# Patient Record
Sex: Female | Born: 1960 | Race: White | Hispanic: No | Smoking: Current every day smoker
Health system: Southern US, Community
[De-identification: ages and names within clinical notes are randomized; demographics above are authoritative.]

## PROBLEM LIST (undated history)

## (undated) DIAGNOSIS — J302 Other seasonal allergic rhinitis: Secondary | ICD-10-CM

## (undated) DIAGNOSIS — G4730 Sleep apnea, unspecified: Secondary | ICD-10-CM

## (undated) DIAGNOSIS — J3089 Other allergic rhinitis: Secondary | ICD-10-CM

## (undated) DIAGNOSIS — F1910 Other psychoactive substance abuse, uncomplicated: Secondary | ICD-10-CM

## (undated) DIAGNOSIS — R000 Tachycardia, unspecified: Secondary | ICD-10-CM

## (undated) DIAGNOSIS — I471 Supraventricular tachycardia, unspecified: Secondary | ICD-10-CM

## (undated) DIAGNOSIS — E785 Hyperlipidemia, unspecified: Secondary | ICD-10-CM

## (undated) DIAGNOSIS — I10 Essential (primary) hypertension: Secondary | ICD-10-CM

## (undated) DIAGNOSIS — F32A Depression, unspecified: Secondary | ICD-10-CM

## (undated) DIAGNOSIS — G43909 Migraine, unspecified, not intractable, without status migrainosus: Secondary | ICD-10-CM

## (undated) DIAGNOSIS — K219 Gastro-esophageal reflux disease without esophagitis: Secondary | ICD-10-CM

## (undated) DIAGNOSIS — M549 Dorsalgia, unspecified: Secondary | ICD-10-CM

## (undated) DIAGNOSIS — F329 Major depressive disorder, single episode, unspecified: Secondary | ICD-10-CM

## (undated) HISTORY — DX: Migraine, unspecified, not intractable, without status migrainosus: G43.909

## (undated) HISTORY — DX: Hyperlipidemia, unspecified: E78.5

## (undated) HISTORY — PX: FOOT SURGERY: SHX648

## (undated) HISTORY — DX: Other allergic rhinitis: J30.89

## (undated) HISTORY — DX: Other psychoactive substance abuse, uncomplicated: F19.10

## (undated) HISTORY — DX: Supraventricular tachycardia: I47.1

## (undated) HISTORY — PX: ABDOMINAL HYSTERECTOMY: SHX81

## (undated) HISTORY — PX: TUBAL LIGATION: SHX77

## (undated) HISTORY — DX: Sleep apnea, unspecified: G47.30

## (undated) HISTORY — DX: Supraventricular tachycardia, unspecified: I47.10

## (undated) HISTORY — PX: CARDIAC CATHETERIZATION: SHX172

---

## 2001-09-02 ENCOUNTER — Ambulatory Visit (HOSPITAL_COMMUNITY): Admission: RE | Admit: 2001-09-02 | Discharge: 2001-09-02 | Payer: Self-pay | Admitting: Family Medicine

## 2001-09-02 ENCOUNTER — Encounter: Payer: Self-pay | Admitting: Family Medicine

## 2001-09-14 ENCOUNTER — Emergency Department (HOSPITAL_COMMUNITY): Admission: EM | Admit: 2001-09-14 | Discharge: 2001-09-14 | Payer: Self-pay | Admitting: Internal Medicine

## 2002-01-01 ENCOUNTER — Ambulatory Visit (HOSPITAL_COMMUNITY): Admission: RE | Admit: 2002-01-01 | Discharge: 2002-01-01 | Payer: Self-pay | Admitting: Family Medicine

## 2002-01-01 ENCOUNTER — Encounter: Payer: Self-pay | Admitting: Family Medicine

## 2002-03-12 ENCOUNTER — Encounter (INDEPENDENT_AMBULATORY_CARE_PROVIDER_SITE_OTHER): Payer: Self-pay

## 2002-03-12 ENCOUNTER — Ambulatory Visit (HOSPITAL_BASED_OUTPATIENT_CLINIC_OR_DEPARTMENT_OTHER): Admission: RE | Admit: 2002-03-12 | Discharge: 2002-03-12 | Payer: Self-pay | Admitting: Obstetrics and Gynecology

## 2002-05-29 ENCOUNTER — Encounter: Payer: Self-pay | Admitting: Internal Medicine

## 2002-05-29 ENCOUNTER — Emergency Department (HOSPITAL_COMMUNITY): Admission: EM | Admit: 2002-05-29 | Discharge: 2002-05-29 | Payer: Self-pay | Admitting: Internal Medicine

## 2002-06-01 ENCOUNTER — Ambulatory Visit (HOSPITAL_COMMUNITY): Admission: RE | Admit: 2002-06-01 | Discharge: 2002-06-01 | Payer: Self-pay | Admitting: *Deleted

## 2002-07-03 ENCOUNTER — Ambulatory Visit (HOSPITAL_COMMUNITY): Admission: RE | Admit: 2002-07-03 | Discharge: 2002-07-03 | Payer: Self-pay | Admitting: Internal Medicine

## 2002-07-07 ENCOUNTER — Encounter (INDEPENDENT_AMBULATORY_CARE_PROVIDER_SITE_OTHER): Payer: Self-pay | Admitting: Specialist

## 2002-07-07 ENCOUNTER — Inpatient Hospital Stay (HOSPITAL_COMMUNITY): Admission: RE | Admit: 2002-07-07 | Discharge: 2002-07-09 | Payer: Self-pay | Admitting: Obstetrics and Gynecology

## 2002-12-01 ENCOUNTER — Ambulatory Visit (HOSPITAL_COMMUNITY): Admission: RE | Admit: 2002-12-01 | Discharge: 2002-12-01 | Payer: Self-pay | Admitting: Family Medicine

## 2002-12-01 ENCOUNTER — Encounter: Payer: Self-pay | Admitting: Family Medicine

## 2003-01-14 ENCOUNTER — Ambulatory Visit (HOSPITAL_COMMUNITY): Admission: RE | Admit: 2003-01-14 | Discharge: 2003-01-14 | Payer: Self-pay | Admitting: Family Medicine

## 2003-04-01 ENCOUNTER — Ambulatory Visit (HOSPITAL_COMMUNITY): Admission: RE | Admit: 2003-04-01 | Discharge: 2003-04-01 | Payer: Self-pay | Admitting: Family Medicine

## 2003-05-11 ENCOUNTER — Ambulatory Visit (HOSPITAL_COMMUNITY): Admission: RE | Admit: 2003-05-11 | Discharge: 2003-05-11 | Payer: Self-pay | Admitting: Neurology

## 2003-08-11 ENCOUNTER — Encounter (HOSPITAL_COMMUNITY): Admission: RE | Admit: 2003-08-11 | Discharge: 2003-09-10 | Payer: Self-pay | Admitting: Family Medicine

## 2004-05-01 ENCOUNTER — Ambulatory Visit (HOSPITAL_COMMUNITY): Admission: RE | Admit: 2004-05-01 | Discharge: 2004-05-01 | Payer: Self-pay | Admitting: Family Medicine

## 2004-09-13 ENCOUNTER — Encounter (HOSPITAL_COMMUNITY): Admission: RE | Admit: 2004-09-13 | Discharge: 2004-10-13 | Payer: Self-pay | Admitting: Family Medicine

## 2005-04-16 ENCOUNTER — Encounter (HOSPITAL_COMMUNITY): Admission: RE | Admit: 2005-04-16 | Discharge: 2005-05-17 | Payer: Self-pay | Admitting: Neurology

## 2005-06-14 ENCOUNTER — Ambulatory Visit (HOSPITAL_COMMUNITY): Admission: RE | Admit: 2005-06-14 | Discharge: 2005-06-14 | Payer: Self-pay | Admitting: Neurology

## 2006-01-21 ENCOUNTER — Ambulatory Visit (HOSPITAL_COMMUNITY): Admission: RE | Admit: 2006-01-21 | Discharge: 2006-01-21 | Payer: Self-pay | Admitting: Family Medicine

## 2007-01-31 ENCOUNTER — Ambulatory Visit (HOSPITAL_COMMUNITY): Admission: RE | Admit: 2007-01-31 | Discharge: 2007-01-31 | Payer: Self-pay | Admitting: Family Medicine

## 2007-03-01 ENCOUNTER — Emergency Department (HOSPITAL_COMMUNITY): Admission: EM | Admit: 2007-03-01 | Discharge: 2007-03-01 | Payer: Self-pay | Admitting: Emergency Medicine

## 2007-06-09 ENCOUNTER — Emergency Department: Payer: Self-pay | Admitting: Emergency Medicine

## 2007-09-22 ENCOUNTER — Ambulatory Visit (HOSPITAL_COMMUNITY): Payer: Self-pay | Admitting: Psychiatry

## 2007-09-29 ENCOUNTER — Ambulatory Visit (HOSPITAL_COMMUNITY): Payer: Self-pay | Admitting: Psychiatry

## 2007-10-07 ENCOUNTER — Ambulatory Visit (HOSPITAL_COMMUNITY): Payer: Self-pay | Admitting: Psychiatry

## 2007-10-29 ENCOUNTER — Ambulatory Visit (HOSPITAL_COMMUNITY): Payer: Self-pay | Admitting: Psychiatry

## 2007-11-12 ENCOUNTER — Ambulatory Visit (HOSPITAL_COMMUNITY): Payer: Self-pay | Admitting: Psychiatry

## 2007-11-19 ENCOUNTER — Ambulatory Visit (HOSPITAL_COMMUNITY): Payer: Self-pay | Admitting: Psychiatry

## 2007-12-10 ENCOUNTER — Ambulatory Visit (HOSPITAL_COMMUNITY): Payer: Self-pay | Admitting: Psychiatry

## 2007-12-25 ENCOUNTER — Ambulatory Visit (HOSPITAL_COMMUNITY): Payer: Self-pay | Admitting: Psychiatry

## 2007-12-31 ENCOUNTER — Ambulatory Visit (HOSPITAL_COMMUNITY): Payer: Self-pay | Admitting: Psychiatry

## 2008-01-15 ENCOUNTER — Ambulatory Visit (HOSPITAL_COMMUNITY): Payer: Self-pay | Admitting: Psychiatry

## 2008-01-28 ENCOUNTER — Ambulatory Visit (HOSPITAL_COMMUNITY): Payer: Self-pay | Admitting: Psychiatry

## 2008-02-18 ENCOUNTER — Ambulatory Visit (HOSPITAL_COMMUNITY): Payer: Self-pay | Admitting: Psychiatry

## 2008-02-25 ENCOUNTER — Ambulatory Visit (HOSPITAL_COMMUNITY): Admission: RE | Admit: 2008-02-25 | Discharge: 2008-02-25 | Payer: Self-pay | Admitting: Family Medicine

## 2008-03-10 ENCOUNTER — Ambulatory Visit (HOSPITAL_COMMUNITY): Admission: RE | Admit: 2008-03-10 | Discharge: 2008-03-10 | Payer: Self-pay | Admitting: Family Medicine

## 2008-07-27 ENCOUNTER — Ambulatory Visit (HOSPITAL_COMMUNITY): Payer: Self-pay | Admitting: Psychiatry

## 2009-08-21 ENCOUNTER — Emergency Department (HOSPITAL_COMMUNITY): Admission: EM | Admit: 2009-08-21 | Discharge: 2009-08-21 | Payer: Self-pay | Admitting: Emergency Medicine

## 2010-03-18 ENCOUNTER — Inpatient Hospital Stay (HOSPITAL_COMMUNITY)
Admission: EM | Admit: 2010-03-18 | Discharge: 2010-03-21 | Payer: Self-pay | Source: Home / Self Care | Attending: Family Medicine | Admitting: Family Medicine

## 2010-03-20 ENCOUNTER — Ambulatory Visit (HOSPITAL_COMMUNITY)
Admission: RE | Admit: 2010-03-20 | Discharge: 2010-03-20 | Payer: Self-pay | Source: Home / Self Care | Attending: Urology | Admitting: Urology

## 2010-03-20 LAB — CBC
HCT: 43.3 % (ref 36.0–46.0)
HCT: 29.4 % — ABNORMAL LOW (ref 36.0–46.0)
Hemoglobin: 14.4 g/dL (ref 12.0–15.0)
Hemoglobin: 9.6 g/dL — ABNORMAL LOW (ref 12.0–15.0)
MCH: 29.3 pg (ref 26.0–34.0)
MCH: 29 pg (ref 26.0–34.0)
MCHC: 33.3 g/dL (ref 30.0–36.0)
MCHC: 32.7 g/dL (ref 30.0–36.0)
MCV: 88 fL (ref 78.0–100.0)
MCV: 88.8 fL (ref 78.0–100.0)
Platelets: 401 10*3/uL — ABNORMAL HIGH (ref 150–400)
Platelets: 222 10*3/uL (ref 150–400)
RBC: 4.92 MIL/uL (ref 3.87–5.11)
RBC: 3.31 MIL/uL — ABNORMAL LOW (ref 3.87–5.11)
RDW: 14.2 % (ref 11.5–15.5)
RDW: 14.5 % (ref 11.5–15.5)
WBC: 14.8 10*3/uL — ABNORMAL HIGH (ref 4.0–10.5)
WBC: 6.7 10*3/uL (ref 4.0–10.5)

## 2010-03-20 LAB — DIFFERENTIAL
Basophils Absolute: 0 10*3/uL (ref 0.0–0.1)
Basophils Absolute: 0 10*3/uL (ref 0.0–0.1)
Basophils Relative: 0 % (ref 0–1)
Basophils Relative: 0 % (ref 0–1)
Eosinophils Absolute: 0 10*3/uL (ref 0.0–0.7)
Eosinophils Absolute: 0.1 10*3/uL (ref 0.0–0.7)
Eosinophils Relative: 0 % (ref 0–5)
Eosinophils Relative: 1 % (ref 0–5)
Lymphocytes Relative: 3 % — ABNORMAL LOW (ref 12–46)
Lymphocytes Relative: 32 % (ref 12–46)
Lymphs Abs: 0.4 10*3/uL — ABNORMAL LOW (ref 0.7–4.0)
Lymphs Abs: 2.1 10*3/uL (ref 0.7–4.0)
Monocytes Absolute: 0.6 10*3/uL (ref 0.1–1.0)
Monocytes Absolute: 0.4 10*3/uL (ref 0.1–1.0)
Monocytes Relative: 4 % (ref 3–12)
Monocytes Relative: 6 % (ref 3–12)
Neutro Abs: 13.8 10*3/uL — ABNORMAL HIGH (ref 1.7–7.7)
Neutro Abs: 4.2 10*3/uL (ref 1.7–7.7)
Neutrophils Relative %: 93 % — ABNORMAL HIGH (ref 43–77)
Neutrophils Relative %: 62 % (ref 43–77)

## 2010-03-20 LAB — URINALYSIS, ROUTINE W REFLEX MICROSCOPIC
Ketones, ur: 15 mg/dL — AB
Nitrite: NEGATIVE
Protein, ur: 100 mg/dL — AB
Specific Gravity, Urine: 1.028 (ref 1.005–1.030)
Urine Glucose, Fasting: 100 mg/dL — AB
Urobilinogen, UA: 1 mg/dL (ref 0.0–1.0)
pH: 5.5 (ref 5.0–8.0)

## 2010-03-20 LAB — POCT I-STAT, CHEM 8
BUN: 17 mg/dL (ref 6–23)
Calcium, Ion: 0.92 mmol/L — ABNORMAL LOW (ref 1.12–1.32)
Chloride: 112 mEq/L (ref 96–112)
Creatinine, Ser: 1.7 mg/dL — ABNORMAL HIGH (ref 0.4–1.2)
Glucose, Bld: 155 mg/dL — ABNORMAL HIGH (ref 70–99)
HCT: 48 % — ABNORMAL HIGH (ref 36.0–46.0)
Hemoglobin: 16.3 g/dL — ABNORMAL HIGH (ref 12.0–15.0)
Potassium: 3.6 mEq/L (ref 3.5–5.1)
Sodium: 140 mEq/L (ref 135–145)
TCO2: 17 mmol/L (ref 0–100)

## 2010-03-20 LAB — LIPID PANEL
Cholesterol: 154 mg/dL (ref 0–200)
HDL: 36 mg/dL — ABNORMAL LOW (ref >39)
LDL Cholesterol: 82 mg/dL (ref 0–99)
Total CHOL/HDL Ratio: 4.3 RATIO
Triglycerides: 181 mg/dL — ABNORMAL HIGH (ref <150)
VLDL: 36 mg/dL (ref 0–40)

## 2010-03-20 LAB — HEPATIC FUNCTION PANEL
ALT: 13 U/L (ref 0–35)
AST: 27 U/L (ref 0–37)
Albumin: 4.5 g/dL (ref 3.5–5.2)
Alkaline Phosphatase: 83 U/L (ref 39–117)
Bilirubin, Direct: 0.1 mg/dL (ref 0.0–0.3)
Indirect Bilirubin: 0.6 mg/dL (ref 0.3–0.9)
Total Bilirubin: 0.7 mg/dL (ref 0.3–1.2)
Total Protein: 8.1 g/dL (ref 6.0–8.3)

## 2010-03-20 LAB — BASIC METABOLIC PANEL
BUN: 6 mg/dL (ref 6–23)
CO2: 21 mEq/L (ref 19–32)
Calcium: 7.7 mg/dL — ABNORMAL LOW (ref 8.4–10.5)
Chloride: 117 mEq/L — ABNORMAL HIGH (ref 96–112)
Creatinine, Ser: 0.57 mg/dL (ref 0.4–1.2)
GFR calc Af Amer: >60 mL/min (ref >60)
GFR calc non Af Amer: >60 mL/min (ref >60)
Glucose, Bld: 87 mg/dL (ref 70–99)
Potassium: 3.2 mEq/L — ABNORMAL LOW (ref 3.5–5.1)
Sodium: 141 mEq/L (ref 135–145)

## 2010-03-20 LAB — URINE MICROSCOPIC-ADD ON

## 2010-03-20 LAB — LIPASE, BLOOD: Lipase: 14 U/L (ref 11–59)

## 2010-03-20 LAB — CLOSTRIDIUM DIFFICILE BY PCR: Toxigenic C. Difficile by PCR: NEGATIVE

## 2010-03-20 LAB — TSH: TSH: 0.309 u[IU]/mL — ABNORMAL LOW (ref 0.350–4.500)

## 2010-03-22 LAB — BASIC METABOLIC PANEL
BUN: <1 mg/dL — ABNORMAL LOW (ref 6–23)
CO2: 23 mEq/L (ref 19–32)
Calcium: 8 mg/dL — ABNORMAL LOW (ref 8.4–10.5)
Chloride: 113 mEq/L — ABNORMAL HIGH (ref 96–112)
Creatinine, Ser: 0.46 mg/dL (ref 0.4–1.2)
GFR calc Af Amer: >60 mL/min (ref >60)
GFR calc non Af Amer: >60 mL/min (ref >60)
Glucose, Bld: 100 mg/dL — ABNORMAL HIGH (ref 70–99)
Potassium: 3.3 mEq/L — ABNORMAL LOW (ref 3.5–5.1)
Sodium: 142 mEq/L (ref 135–145)

## 2010-03-22 LAB — CBC
HCT: 26.4 % — ABNORMAL LOW (ref 36.0–46.0)
Hemoglobin: 8.7 g/dL — ABNORMAL LOW (ref 12.0–15.0)
MCH: 28.8 pg (ref 26.0–34.0)
MCHC: 33 g/dL (ref 30.0–36.0)
MCV: 87.4 fL (ref 78.0–100.0)
Platelets: 198 10*3/uL (ref 150–400)
RBC: 3.02 MIL/uL — ABNORMAL LOW (ref 3.87–5.11)
RDW: 14.3 % (ref 11.5–15.5)
WBC: 5.8 10*3/uL (ref 4.0–10.5)

## 2010-03-22 LAB — URINE CULTURE
Colony Count: 8000
Culture  Setup Time: 201201160228

## 2010-03-22 LAB — RAPID URINE DRUG SCREEN, HOSP PERFORMED
Amphetamines: NOT DETECTED
Barbiturates: NOT DETECTED
Benzodiazepines: NOT DETECTED
Cocaine: NOT DETECTED
Opiates: POSITIVE — AB
Tetrahydrocannabinol: NOT DETECTED

## 2010-03-22 LAB — SODIUM, URINE, RANDOM: Sodium, Ur: 70 mEq/L

## 2010-03-22 LAB — T3, FREE: T3, Free: 3.1 pg/mL (ref 2.3–4.2)

## 2010-03-22 LAB — T4, FREE: Free T4: 0.82 ng/dL (ref 0.80–1.80)

## 2010-03-22 LAB — CREATININE, URINE, RANDOM: Creatinine, Urine: 34.2 mg/dL

## 2010-03-27 NOTE — Op Note (Signed)
  Heather Cruz, Heather Cruz                  ACCOUNT NO.:  [id number]  MEDICAL RECORD NO.:  [id number]          PATIENT TYPE:  INP  LOCATION:  6704                         FACILITY:  [facility]  PHYSICIAN:  Excell Seltzer. Annabell Howells, M.D.    DATE OF BIRTH:  Aug 28, 1960  DATE OF PROCEDURE:  03/20/2010 DATE OF DISCHARGE:                              OPERATIVE REPORT   Patient of Dr. Pearlean Brownie.  PROCEDURE:  Cystoscopy, left ureteroscopic stone extraction.  PREOPERATIVE DIAGNOSIS:  Left ureterovesical junction stone.  POSTOPERATIVE DIAGNOSIS:  Left ureterovesical junction stone.  SURGEON:  Excell Seltzer. Annabell Howells, MD  ANESTHESIA:  General.  SPECIMEN:  Stone.  DRAIN:  None.  COMPLICATIONS:  None.  INDICATIONS:  [name] is a 50 year old white female with a 40-month history of hematuria and intermittent left flank pain.  She has not been able to be maintained on oral pain medicines and a CT scan revealed a 7 mm left distal ureteral stone.  She has elected to undergo ureteroscopy.  FINDINGS AND PROCEDURE:  She was given Cipro.  She was taken to operating room where general anesthetic was induced.  She was placed in lithotomy position.  Her perineum and genitalia were prepped with Betadine solution.  She was draped in usual sterile fashion.  Cystoscopy was performed with a 22-French scope and 12-degree lens examination revealed a normal urethra.  The bladder wall was smooth and pale without tumor, stones, or inflammation.  Ureteral orifices were unremarkable.  A guidewire was passed up the left ureteral orifice and the stone could be seen alongside the wire under fluoroscopy.  A 12-French introducer sheath dilator was passed over the wire to the stone to dilate the ureter and a 6-French short ureteroscope was then passed alongside the wire.  The stone was grasped with a nitinol basket and removed without difficulty.  It was not felt the stent was needed, so the wire was removed.  The bladder was  drained.  A B and O suppository was placed. She was taken down from lithotomy position, her anesthetic was reversed, and she was moved to the recovery room in stable condition.     Excell Seltzer. Annabell Howells, M.D.     JJW/MEDQ  D:  03/20/2010  T:  03/20/2010  Job:  [id number]  Electronically Signed by Bjorn Pippin M.D. on 03/27/2010 08:13:49 AM

## 2010-03-31 NOTE — Discharge Summary (Signed)
Heather Cruz, Heather Cruz                  ACCOUNT NO.:  [id number]  MEDICAL RECORD NO.:  [id number]          PATIENT TYPE:  INP  LOCATION:  6704                         FACILITY:  [facility]  PHYSICIAN:  Heather Cruz, M.D.    DATE OF BIRTH:  February 28, 1961  DATE OF ADMISSION:  03/18/2010 DATE OF DISCHARGE:  03/21/2010                              DISCHARGE SUMMARY   PRIMARY CARE Heather Cruz:  [facility]  CONSULTANT:  Urology.  DISCHARGE DIAGNOSES: 1. Kidney stone status post cystoscopy. 2. Tachycardia. 3. Acute renal failure, resolved.  DISCHARGE MEDICATIONS: 1. Ibuprofen 600 mg p.o. four times a day p.r.n. for pain. 2. Percocet 5/325 mg p.o. q.4-6 h. p.r.n. for pain.  Continued Medications: 1. Benadryl 25 mg 1-2 tablets p.o. b.i.d. p.r.n. for itching. 2. Ranitidine 75 mg p.o. b.i.d.  PERTINENT LABORATORY VALUES:  On March 18, 2010:  An i-STAT; total CO2 is 17, ionized calcium 0.92, hemoglobin 16.3, hematocrit 48.0, sodium 140, potassium 3.6, chloride 112, glucose 155, BUN 17, creatinine 1.7. CBC with differential; white blood cell count 14.8, hemoglobin 14.4, hematocrit 43.3, platelets 401, neutrophils 93%, lymphocytes 3%, monocytes 4%.  A urinalysis was significant for urine glucose 100, bilirubin moderate, ketones 15, blood large, protein 100, and leukocytes small.  Urine microscopic showed squamous epithelial cells few, hyaline casts, white blood cells 3-6, red blood cells 11-12, bacteria few.  TSH was 0.309. On March 20, 2010:  Basic metabolic panel; sodium 142, potassium 3.3, chloride 113, CO2 is 23, glucose 100, BUN less than 1, creatinine 0.46, calcium 8.0.  A urine drug screen was negative.  Free T4 was 0.82 within normal limits and free T3 was 3.1 within normal limits.  Urine culture was negative.  RADIOLOGY:  A CT abdomen and pelvis without contrast showed obstruction of left kidney by distal ureteral calculus measuring 3 x 7 mm.  PROCEDURES:   On March 20, 2010, Urology performed a cystoscopy, left ureteroscopic stone extraction.  BRIEF HOSPITAL COURSE:  Ms. Heather Cruz is a 50 year old lady with past medical history of kidney stones who presented to the emergency department with nausea, vomiting, fever, flank pain.  She was found to have a kidney stone. 1. The patient was admitted for pain control.  She was placed on IV     fluids and Flomax and IV morphine and given a urine strainer;     however, the patient was unable to pass the kidney stone alone     after 2 days.  Urology was consulted, and they did a cystoscopy to     remove the stone on August 19, 2010.  The day of the procedure, the     patient was feeling much better, minimal pain, increased p.o.     intake. 2. Tachycardia.  The patient had mild tachycardia in the 110s while in     the hospital.  The patient reported she had had this problem for a     long time and had previous ablations that had failed to control her     tachycardia.  A TSH was checked which was low; however, free  T3 and     T4 were within normal limits.  The day after her kidney stone     removal, her tachycardia had resolved with pulse rate lower than a     100.  The [facility] Medicine Team felt that this was stable normal for     this patient and did not start any intervention. 3. Acute renal failure.  The patient came in with a creatinine of 1.7,     and she was hydrated.  She was dry on exam and so she was hydrated     with normal saline on admission.  Her creatinine normalized to 0.84     before discharge.  It was felt this was due to poor p.o. intake,     dehydration, and kidney stone.  FOLLOWUP ISSUES AND RECOMMENDATIONS:  The patient is to follow up with Urology in 3-4 weeks.  The patient is also recommended to follow up with Dr. Gerda Cruz at [facility] in 1-2 weeks.  The patient was discharged home in stable medical condition.    ______________________________ Heather Gal, MD   ______________________________ Heather Norris. Sheffield Cruz, M.D.    CR/MEDQ  D:  03/21/2010  T:  03/21/2010  Job:  [id number]  Electronically Edited and Signed  By Heather Gal MD on 03/26/2010 06:55:23 PM Electronically Signed by Heather Cruz M.D. on 03/31/2010 06:31:38 PM

## 2010-04-19 NOTE — H&P (Signed)
Heather Cruz, Heather Cruz                  ACCOUNT NO.:  [id number]  MEDICAL RECORD NO.:  [id number]          PATIENT TYPE:  INP  LOCATION:  1825                         FACILITY:  [facility]  PHYSICIAN:  Pearlean Brownie, M.D.DATE OF BIRTH:  1960-05-19  DATE OF ADMISSION:  03/18/2010 DATE OF DISCHARGE:                             HISTORY & PHYSICAL   PRIMARY CARE PROVIDER:  Unassigned.  CHIEF COMPLAINT:  Kidney stones.  HISTORY OF PRESENT ILLNESS:  This is a 50 year old female with self- reported history of kidney stones who presents with severe nausea, vomiting, diarrhea, and abdominal pain x1 day.  The patient states her symptoms have began acutely yesterday evening.  The patient woke up this morning with intractable nonbloody, nonbilious emesis as well as nonbloody diarrhea.  The patient denies any sick contacts.  The patient does report dysuria as well as hematuria.  The patient states that nausea, vomiting, and abdominal pain persists throughout the day and has been unable to tolerate by mouth intake as well as progressively decreasing urine output.  The patient subsequently came to the ED for evaluation and was found to have a 3 x 7 mm kidney stone on the left distal ureter.  The patient has also noted to have sinus tachycardia into the 130s.  The patient states she has had a history of sinus tachycardia before status post ablation several years ago.  The patient was previously on Inderal per the patient.  However, the patient has been off medications for greater than 2 years as she has been unemployed.  The patient denies any chest pain, palpitations, dyspnea, or orthopnea.  ALLERGIES:  PENICILLIN.  PAST MEDICAL HISTORY: 1. History of kidney stones. 2. History of depression. 3. 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 4. Hyperlipidemia. 5. GERD.  MEDICATIONS:  The patient currently takes over-the-counter Tylenol, Benadryl, and Prilosec.  The patient does report  a history of taking Cymbalta, Inderal, as well as TriCor and Protonix in the past for chronic medical problems, however the patient has not been able to afford this medications over the past 2 years.  PAST SURGICAL HISTORY: 1. Ablation in 2000. 2. History of partial hysterectomy in 2000. 3. Morton's neuroma removal x3.  SOCIAL HISTORY:  The patient currently lives with her ex-husband as well as ex-stepdaughter.  The patient is currently unemployed.  The patient reportedly smokes 1 pack per day of cigarettes.  The patient denies any alcohol or other drug use.  FAMILY HISTORY:  Family history is negative for kidney stones, hypertension, and diabetic disease.  REVIEW OF SYSTEMS:  Review of systems positive for chills, sweats, fatigue, nausea, vomiting, diarrhea as well as hematuria and dysuria. No myalgias, chest pain, orthopnea, PND, or palpitations.  PHYSICAL EXAMINATION:  VITAL SIGNS:  Temperature 98.9, heart rate 118- 135, respirations 14-20, blood pressure 97 to 129 over 55 to 72, satting98% on room air, and weight 57.2 kg. GENERAL:  The patient is in bed, resting in minimal distress. HEENT:  Normocephalic, atraumatic.  Extraocular movements intact.  Dry mucous membranes. NECK:  Supple.  Full range of motion. CARDIOVASCULAR:  Tachycardic without murmur. LUNGS:  Clear  to auscultation bilaterally.  No wheezes, rales, or rhonchi. ABDOMEN:  Positive test on palpation in the lower abdomen diffusely most prominent on the left side. EXTREMITIES:  A 2+ peripheral pulses.  No edema. NEUROLOGIC:  Grossly intact. MUSCULOSKELETAL:  A 5/5 strength diffusely.  LABORATORY DATA:  Labs CBC; white count 14.8, hemoglobin 14.4, and platelet count 401. CMET; sodium 140, potassium 3.6, chloride 112, BUN 17, creatinine 1.7. T bili is 0.74, alk phos 83, AST 27, ALT 13, total protein 8.1, and albumin 4.5. Lipase of 14. UA showing orange-to-turbid appearance, specific gravity of 10.28, moderate  bili, 15 ketones, large blood, protein 100, small leukocytes and hyaline casts.  IMAGING DATA:  CT abdomen and pelvis without contrast showing obstructing left stone and distal ureter measuring 3 x 7 mm.  ASSESSMENT/PLAN:  This is a 50 year old female with past medical history of nephrolithiasis presenting with acute kidney stone, acute renal failure with sinus tachycardia.  1. Kidney stone.  Urology initially consulted given that kidney stone     is greater than 5 mm on CT with recommendation to continue Flomax,     to continue aggressive IV fluid hydration as well as drain of her     urine.  Zofran for nausea.  Morphine for pain.  We will hold on     NSAIDs given the patient is also in acute renal failure. 2. Acute renal failure.  The patient is noted to have baseline     creatinine of 0.7 as of 2011.  Today creatinine is 1.7, which is     likely secondary to upper and lower gastrointestinal losses.  We     will check FENa.  We will continue aggressive hydration.  CT scan     is positive for obstruction on the left-side and we will continue     to closely follow with Urology for adequate urine output and     adequate diuresis. 3. Tachycardia.  The patient reports being status post ablation     several years ago, was previously on Inderal.  We will start the     patient on low-dose Lopressor for BP for better beta selective heart     rate control in the setting of volume resuscitation.  The patient     is overall asymptomatic.  We will also check a TSH and the patient     may require inpatient versus outpatient echo for further     evaluation. 4. Fluids, electrolyte, and nutrition/Gastrointestinal.  Advance diet     as tolerated.  Aggressive fluid hydration and PPI. 5. Hyperlipidemia.  We will check fasting lipid panel. 6. Prophylaxis, heparin and Protonix. 7. Disposition.  Pending clinical improvement.     Doree Albee, MD   ______________________________ Pearlean Brownie, M.D.    SN/MEDQ  D:  03/18/2010  T:  03/18/2010  Job:  [id number]  Electronically Signed by Doree Albee  on 04/01/2010 09:56:22 AM Electronically Signed by Pearlean Brownie M.D. on 04/19/2010 04:56:06 PM

## 2010-05-11 ENCOUNTER — Emergency Department (HOSPITAL_COMMUNITY)
Admission: EM | Admit: 2010-05-11 | Discharge: 2010-05-11 | Disposition: A | Discharge status: Home / Self Care | Payer: Self-pay | Attending: Emergency Medicine | Admitting: Emergency Medicine

## 2010-05-11 DIAGNOSIS — F3289 Other specified depressive episodes: Secondary | ICD-10-CM | POA: Insufficient documentation

## 2010-05-11 DIAGNOSIS — I499 Cardiac arrhythmia, unspecified: Secondary | ICD-10-CM | POA: Insufficient documentation

## 2010-05-11 DIAGNOSIS — F329 Major depressive disorder, single episode, unspecified: Secondary | ICD-10-CM | POA: Insufficient documentation

## 2010-05-11 DIAGNOSIS — G43909 Migraine, unspecified, not intractable, without status migrainosus: Secondary | ICD-10-CM | POA: Insufficient documentation

## 2010-05-11 DIAGNOSIS — E7800 Pure hypercholesterolemia, unspecified: Secondary | ICD-10-CM | POA: Insufficient documentation

## 2010-05-21 LAB — POCT I-STAT, CHEM 8
Creatinine, Ser: 0.7 mg/dL (ref 0.4–1.2)
Hemoglobin: 15 g/dL (ref 12.0–15.0)
Sodium: 141 mEq/L (ref 135–145)
TCO2: 25 mmol/L (ref 0–100)

## 2010-05-21 LAB — CBC
Hemoglobin: 14.3 g/dL (ref 12.0–15.0)
RBC: 4.75 MIL/uL (ref 3.87–5.11)

## 2010-05-21 LAB — URINALYSIS, ROUTINE W REFLEX MICROSCOPIC
Glucose, UA: NEGATIVE mg/dL
Specific Gravity, Urine: 1.009 (ref 1.005–1.030)

## 2010-05-21 LAB — DIFFERENTIAL
Lymphocytes Relative: 31 % (ref 12–46)
Monocytes Absolute: 0.6 10*3/uL (ref 0.1–1.0)
Monocytes Relative: 5 % (ref 3–12)
Neutro Abs: 7.5 10*3/uL (ref 1.7–7.7)

## 2010-05-21 LAB — URINE MICROSCOPIC-ADD ON

## 2010-07-21 NOTE — Op Note (Signed)
NAME:  Heather Cruz, Heather Cruz                            ACCOUNT NO.:  [id number]   MEDICAL RECORD NO.:  [id number]                   PATIENT TYPE:  INP   LOCATION:  NA                                   [facility]AC[facility]   PHYSICIAN:  Katherine Roan, M.D.               DATE OF BIRTH:  19-Mar-1960   DATE OF PROCEDURE:  07/07/2002  DATE OF DISCHARGE:                                 OPERATIVE REPORT   PREOPERATIVE DIAGNOSIS:  Persistent menorrhagia despite birth control pills,  hysteroscopy with resection, and laparoscopy.   POSTOPERATIVE DIAGNOSIS:  Persistent menorrhagia despite birth control  pills, hysteroscopy with resection, and laparoscopy.   OPERATION:  1. Pelvic exam under anesthesia.  2. Total abdominal hysterectomy.  3. Exploratory laparotomy.   DESCRIPTION OF PROCEDURE:  The patient was placed in the frogleg position  after suitable anesthesia was administered.  Pelvic exam reveals a mid plane  uterus that appeared to be normal size with no masses.  The patient was then  prepped and draped in the usual fashion.  Foley catheter was inserted.  A  transverse incision was made in the abdomen and hemostasis with the Bovie.  The peritoneum was entered vertically.  There was a midline ventral defect  just at the xiphoid.  The liver appeared to be smooth.  Kidneys were normal.  No evident periaortic adenopathy.  No free fluid.  Both ovaries were normal.  There was some endometriosis and endometriotic changes in the left cornua  and in the cul-de-sac.  Cornual aspects of the uterus were grasped with  Kelly clamps, and the uteroovarian anastomosis and tube were clamped and  ligated with 0 chromic.  The round ligaments were handled in a similar  fashion.  The vessels were skeletonized and ligated with 0 chromic suture.  Bladder flap was created.  The bladder flap was created.  The bladder was  pushed off the lower segment.  Uterosacral and cardinals were clamped and  ligated with 0 chromic.   The angles of the vagina were entered, specimens  removed from the operative field was consisting of the uterus.  The angles  of the vagina were transfixed with 0 chromic and then 0 Vicryl.  The  uterosacral ligaments were plicated in the midline to ensure good vault  support.  Both ovaries were normal.  Hemostasis was secure.  We irrigated  the pelvis with copious amounts of saline.  Parietal peritoneum was closed  with 2-0 PDS as well as the fascia in a continuous fashion and interrupted 2-  0 PDS in the midline.  A subcuticular suture of 3-0 plain was used to close  the skin, and the incision was infiltrated with 20 mL of 0.5% Marcaine with  epinephrine.  [name] tolerated the procedure well and was sent to the  recovery room in good condition.  Katherine Roan, M.D.    SDM/MEDQ  D:  07/07/2002  T:  07/07/2002  Job:  [id number]

## 2010-07-21 NOTE — Op Note (Signed)
Heather Cruz, Heather Cruz                            ACCOUNT NO.:  [id number]   MEDICAL RECORD NO.:  [id number]                   PATIENT TYPE:  OIB   LOCATION:  3737                                 FACILITY:  [facility]   PHYSICIAN:  Duke Salvia, M.D.               DATE OF BIRTH:  05/23/1960   DATE OF PROCEDURE:  07/03/2002  DATE OF DISCHARGE:                                 OPERATIVE REPORT   PREOPERATIVE DIAGNOSIS:  Supraventricular tachycardia.   POSTOPERATIVE DIAGNOSIS:  Slow-fast atrioventricular reentrant tachycardia.   PROCEDURE:  Invasive electrophysiological study, isoproterenol/atropine  infusion, and radiofrequency catheter ablation.   DESCRIPTION OF PROCEDURE:  Following the obtaining of informed consent, the  patient was brought to the electrophysiology laboratory and placed on the  fluoroscopic table in the supine position.  After routine prep and drape,  cardiac catheterization was performed with local anesthesia and conscious  sedation.  Noninvasive blood pressure monitoring, end-tidal CO2 monitoring,  and oxygen saturation monitoring were performed continuously throughout the  procedure.  At the end of the procedure the catheters were removed,  hemostasis was obtained, and the patient was transferred to the floor in  stable condition.   Catheters:  A 5 French quadripolar catheter was inserted via the left  femoral vein to the right ventricular apex.  A 5 French quadripolar catheter  was inserted via the left femoral vein to the AV junction.  A 5 French  quadripolar catheter was inserted via the left femoral vein to the high  right atrium.  A 6 French octapolar catheter was inserted via the right  femoral vein to the coronary sinus.  A 7 French quadripolar ablation  catheter was inserted via the right femoral vein using an SL-2 sheath to  mapping sites in the posterior septal space.   Surface leads I, aVF, and V1 were monitored continuously throughout the  procedure.   Following insertion of the catheters, the stimulation protocol  included incremental atrial pacing; incremental ventricular pacing; single  atrial extrastimuli at pace cycle lengths of 600s, 550, 500, and 400 msec;  double atrial extrastimuli at 400 msec in the presence and in the absence of  isoproterenol.   RESULTS:  SURFACE ELECTROCARDIOGRAM:  Rhythm:  Sinus (initial), sinus (final).  Cycle length:  655 msec (initial), 482 msec (final) (on isoproterenol).  PR interval:  122 msec (initial), 115 msec (final).  QRS duration:  98 msec (initial), 94 msec (final).  QT interval:  370 msec (initial), 340 msec (final).  P-wave duration:  90 msec (initial), 75 msec (final).   INTRACARDIAC INTERVALS:  AH interval:  80 msec (initial), 71 msec (final).  HV interval:  36 msec (initial), 34 msec (final).  His bundle duration:  19 msec (initial), 17 msec (final).  Bundle branch block:  Absent (initial), absent (final).  Pre-excitation:  Absent (initial), absent (final).   AV NODAL FUNCTION:  AV Wenckebach  preablation was less than 400 msec, and VA Wenckebach was less  than 300 msec with conduction going from 1:1 to 2:1 AT 290 msec.  The AV nodal effective refractory period at a pace cycle length of 500 msec  in the fast pathway was 400 msec, in the slow pathway was 290 msec.  The AV nodal conduction was discontinuous with an echo beat preablation and  had intermittent discontinuity and echo beats post ablation.  Retrograde conduction was continuous pre- and post ablation.   ACCESSORY PATHWAY FUNCTION:  No evidence of an accessory pathway was  identified.   ARRHYTHMIAS INDUCED:  Typical slow-fast AV nodal reentrant tachycardia was  inducible in the context of atropine and isoproterenol.  Initially prior to  the combination drugs, either single or double echo beats were all that were  inducible.  At  400:250:300 typical slow-fast AV nodal reentrant tachycardia  was induced with a cycle length  of 285 msec.  It terminated spontaneously.   Characteristics of the tachycardia included:  (a) Earliest atrial activation  the septum; (b) dependent on AH prolongation; (c) in a typical episode of  tachycardia the VA time was 0 msec, the HA time was 37 msec, and the  AH  time was 245 msec.   FLUOROSCOPY TIME:  Less than five minutes of fluoroscopy time was used at 15  frames per second.   RADIOFREQUENCY ENERGY:  A total of 40 seconds of RF energy was applied in a  single application on the posterior septal space.  Junctional rhythm was  generated at three separate sites during the drawback through the tricuspid  annulus toward the coronary sinus at the level of the coronary sinus os.  Following ablation, tachycardia was no longer inducible in the presence or  in the absence of isoproterenol plus/minus atropine as well as discontinuity  and any echo beats being only intermittently detected.   IMPRESSION:  1. Relatively rapid sinus rates with persistent sinus tachycardia after the     procedure; this is something that has been described in the patient     before.  2. Normal atrial function.  3. 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  Slow pathway modification     successfully eliminated the substrate for the patient's tachycardia and     rendered her noninducible.  4. Normal His-Purkinje system function.  5. No accessory pathway.  6. Normal ventricular response to programmed stimulation as described above.   SUMMARY AND CONCLUSION:  The results of electrophysiological testing  identified slow-fast AV nodal reentrant tachycardia as the patient's  mechanism of her SVT.  Slow pathway modification successfully eliminated  the substrate and rendered her noninducible.  The patient also has fatigue and resting tachycardia.  I had given some thought to doing tilt table  testing prior to her EP study; however, this did not  happen.  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                                                 Duke Salvia, M.D.    SCK/MEDQ  D:  07/03/2002  T:  07/03/2002  Job:  [id number]   cc:   Vida Roller, M.D.  Fax: [phone]   W. Simone Curia,  M.D.  [address]  [place]  [place] [address]  Fax: [phone]   Electrophysiology Laboratory

## 2010-07-21 NOTE — Op Note (Signed)
Heather Cruz, Heather Cruz                            ACCOUNT NO.:  [id number]   MEDICAL RECORD NO.:  [id number]                   PATIENT TYPE:  AMB   LOCATION:  [facility]                                 FACILITY:  [facility]   PHYSICIAN:  Katherine Roan, M.D.               DATE OF BIRTH:  04-08-1960   DATE OF PROCEDURE:  03/12/2002  DATE OF DISCHARGE:                                 OPERATIVE REPORT   PREOPERATIVE DIAGNOSES:  Menorrhagia and dysmenorrhea.   POSTOPERATIVE DIAGNOSES:  Menorrhagia and dysmenorrhea. Focal endometriosis  and submucous myomas.   OPERATION:  Pelvic exam under anesthesia, laparoscopy with LUNA procedure  and obliteration of endometriosis of left pelvic sidewall, hysteroscopy with  resection of numerous submucosal nodules.   DESCRIPTION OF PROCEDURE:  The patient was placed in lithotomy position,  prepped and draped in the usual fashion. Pelvic exam under anesthesia was  accomplished, the uterus was retroverted, normal, slightly enlarged, soft  and boggy. Adnexa negative. The bladder was emptied. A transverse incision  was made in the abdomen. The Veress needle was inserted, aspiration infusion  was done. The abdomen was then distended with 2 liters of CO2 and a #10/11  trocar was inserted into the abdomen. Visualization of the pelvis was  accomplished. Both ovaries were normal. She was status post tubal ligation.  There was no evidence of endometriosis anteriorly. The uterus was boggy.   No other abnormalities were noted within the abdomen. The cul-de-sac had an  adhesion just at the insertion of the left uterosacral ligaments. This was  consistent with an old area of endometriosis. Using the YAG laser, a LUNA  procedure was performed along with obliteration of the focal endometriosis.  Gas was then evacuated, the skin incisions were closed with #0 Vicryl on a  UR-6 needle following which I closed the skin with 4-0 PDS. The incisions  both infraumbilical and  suprapubic were infiltrated with 0.5% Marcaine. The  suprapubic trocar site was used for manipulation. I then went down below and  carefully dilated the cervix, it actually was easy to dilate to 29 [place]. I  dilated it up to 74 [place] and inserted a hysteroscope which revealed a  submucous myoma particularly on the right side and using the resectoscope I  resected this myoma along with one anterior. All of the resected material  was sent to the lab at the termination of the procedure. I curetted the  uterine cavity with removal of the remaining portion of the chips. No unusal  blood loss occurred. Prior to doing the hysteroscopy, I injected 20 cc of a  Pitressin solution. The patient tolerated the procedure well and was sent to  the recovery room in good condition.  Katherine Roan, M.D.    SDM/MEDQ  D:  03/12/2002  T:  03/12/2002  Job:  [id number]

## 2010-07-21 NOTE — H&P (Signed)
[name], Cruz                            ACCOUNT NO.:  [id number]   MEDICAL RECORD NO.:  [id number]                   PATIENT TYPE:  INP   LOCATION:  NA                                   FACILITY:  [facility]   PHYSICIAN:  Katherine Roan, M.D.               DATE OF BIRTH:  Jul 29, 1960   DATE OF ADMISSION:  07/07/2002  DATE OF DISCHARGE:                                HISTORY & PHYSICAL   CHIEF COMPLAINT:  Continue heavy periods.   HISTORY OF PRESENT ILLNESS:  The patient is a 50 year old female gravida 1  with persistent heavy periods despite hysteroscopy and laparoscopy.  At the  time of hysteroscopy she had a resection of submucous fibroid and  endometriosis was noted on laparoscopy.  She continues to have heavy periods  despite oral contraceptives.  She is currently taking Lipitor and Paxil.  She has had right foot surgery in 2000, left foot surgery in 2002,  laparoscopy and hysteroscopy.  She is allergic to PENICILLIN.  She is  currently having a cardiac catheterization tomorrow.  She has been treated  by Dr. Graciela Husbands for tachycardia.   REVIEW OF SYSTEMS:  HEENT:  No headaches, no decrease in visual or auditory  acuity.  No history of dizziness.  HEART:  She has SVT and is currently  under treatment with Dr. Graciela Husbands and having radioablation.  No history of  heart murmur, no history of mitral valve prolapse.  LUNGS:  No chronic  cough.  GU:  She has kidney stones but no history of incontinence.  She has  urge.  GI:  No bowel habit change, no melena, no weight loss.  MUSCLE,  BONES, AND JOINTS:  No fractures or arthritis.   SOCIAL HISTORY:  She works for [employer], smokes a pack of cigarettes daily.   FAMILY HISTORY:  Her father is 92 and is diabetic.  She has one brother and  one sister in good health.  She has a grandmother who had a myocardial  infarction and her grandfather had a heart attack, and a grandfather with a  CVA.  Her father is diabetic as is her a paternal  grandfather.   PHYSICAL EXAMINATION:  GENERAL:  Reveals a well-developed, well-nourished  female who appears to be her stated age of 50.  She is oriented to time,  place, and recent events.  VITAL SIGNS:  Weight 143, blood pressure 102/60.  HEENT:  Unremarkable.  The oropharynx is not injected.  NECK:  Supple.  The thyroid is not enlarged.  Carotid pulses are equal  without bruits.  No adenopathy.  BREASTS:  No masses or tenderness.  LUNGS:  Clear to P&A.  HEART:  Normal sinus rhythm today without murmurs.  ABDOMEN:  Soft and flat.  Liver, spleen, and kidneys are not palpated.  PELVIC:  Reveals vulva and vagina to be normal.  The cervix is clean.  Uterus  is mid plan to slightly retroverted.  Adnexa negative.  Rectovaginal  confirms.   IMPRESSION:  Continued heavy periods, history of submucous fibroids.    PLAN:  TAH and necessary procedures.  Risks and benefits discussed with the  patient including infection, hemorrhage, damage to bladder and bowel.  She  is on a bowel prep.                                               Katherine Roan, M.D.    SDM/MEDQ  D:  07/02/2002  T:  07/02/2002  Job:  [id number]

## 2010-07-21 NOTE — Discharge Summary (Signed)
   NAMEDORTHY, MAGNUSSEN                            ACCOUNT NO.:  [id number]   MEDICAL RECORD NO.:  [id number]                   PATIENT TYPE:  INP   LOCATION:  0449                                 FACILITY:  [facility]   PHYSICIAN:  Katherine Roan, M.D.               DATE OF BIRTH:  01-30-1961   DATE OF ADMISSION:  07/07/2002  DATE OF DISCHARGE:  07/09/2002                                 DISCHARGE SUMMARY   ADMISSION DIAGNOSES:  1. Persistent menorrhagia.  2. History of submucous fibroids.   HISTORY OF PRESENT ILLNESS:  Ms. Doleman is a 50 year old female who presents  with heavy periods.  She is status post laparoscopy with resection and she  is status post using oral contraceptives to control her periods, but neither  has helped.  She was admitted for hysterectomy.   LABORATORY DATA:  Hemoglobin on admission was 9.8, hematocrit 29, MCV was  77.   HOSPITAL COURSE:  The patient was admitted to the hospital, underwent a  pelvic examination under anesthesia and hysterectomy.  Her postoperative  course was uncomplicated.  On the second postoperative day, she was  discharged, and given instructions to call for fever, bleeding, any  difficulty with pain.  She expressed understanding of the surgery, and she  is to return to the office in two weeks.   CONDITION ON DISCHARGE:  Improved.                                               Katherine Roan, M.D.    SDM/MEDQ  D:  07/23/2002  T:  07/23/2002  Job:  [id number]

## 2010-07-21 NOTE — Discharge Summary (Signed)
   Heather Cruz, PINHEIRO                            ACCOUNT NO.:  [id number]   MEDICAL RECORD NO.:  [id number]                   PATIENT TYPE:  INP   LOCATION:  NA                                   FACILITY:  [facility]   PHYSICIAN:  Katherine Roan, M.D.               DATE OF BIRTH:  Nov 16, 1960   DATE OF ADMISSION:  07/03/2002  DATE OF DISCHARGE:  07/03/2002                                 DISCHARGE SUMMARY   DIAGNOSES:  Supraventricular tachycardia.   HISTORY OF PRESENT ILLNESS:  This is a 50 year old female with a past  medical history of tachycardia with significant lightheadedness who  presented to the emergency room with a heart rate of 200 beats per minute.  She received Adenosine with the termination of tachycardia and restoration  of normal sinus rhythm.  She also had a history of prior to this of episodes  of lightheadedness.  This has not been a problem in the past.  The patient  had normal thyroid studies and CBC.  She also has menomenorrhea with  anticipated hysterectomy.  Cardiac evaluation includes normal ultrasound,  mild abnormal LFTs.   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  The patient was  admitted and underwent a successful radiofrequency catheter ablation, slow  pathway modification with residual sinus tachycardia.  Postoperatively, the  patient had no immediate postoperative complications.  She remains in normal  sinus rhythm to sinus tachycardia.  She was anticipated to be discharged  later this evening at 6 p.m. if hemodynamically stable.   DISCHARGE MEDICATIONS:  1. She was discharged to home on her previous medications Claritin 10 mg     q.d.  2. Propranolol.  3. Inderal 10 mg b.i.d.  4. Paxil 30 mg q.d.  5. Loestrin 1 q.d.  6. Coated aspirin 325 mg q.d. for the next week.  7. Antibiotics as before any GYN, urine test, or dental procedures for the     next three months.  8. She is to take Tylenol 1-2 tablets every 4-6h. as needed.   ACTIVITY:  No heavy lifting or strenuous activity for four days.  No driving  for two days.   DIET:  Low fat, low cholesterol diet.    DISCHARGE INSTRUCTIONS:  She is to call the office for any drainage in her  groin.  She is to follow up with Dr. Duke Salvia on August 28, 2002 at  2:15 p.m.  The patient was also instructed to increase her salt and water  intake.     Chinita Pester, C.R.N.P. [facility]                 Katherine Roan, M.D.    DS/MEDQ  D:  07/03/2002  T:  07/03/2002  Job:  [id number]   cc:   Duke Salvia, M.D.   Vida Roller, M.D.  Fax: [phone]

## 2010-07-21 NOTE — Procedures (Signed)
   Heather Cruz, Heather Cruz                            ACCOUNT NO.:  [id number]   MEDICAL RECORD NO.:  [id number]                   PATIENT TYPE:  OUT   LOCATION:  RAD                                  FACILITY:  [facility]   PHYSICIAN:  Vida Roller, M.D.                DATE OF BIRTH:  Jun 17, 1960   DATE OF PROCEDURE:  06/01/2002  DATE OF DISCHARGE:                                  ECHOCARDIOGRAM   TAPE NUMBER:  [id number]   TAPE COUNT:  [id number]   CLINICAL INFORMATION:  This is a 50 year old with SVT.   TECHNICAL QUALITY:  The technical quality of the study was adequate.   MEASUREMENTS M-MODE:  1. The aorta is 30 mm.  2. The left atrium is 35 mm.  3. The septum is 10 mm.  4. The posterior wall is 10 mm.  5. The left ventricular diastolic dimension is 31 mm.  6. The left ventricular systolic dimension is 21 mm.   2-D AND DOPPLER IMAGING:  1. The left ventricle is normal size with normal systolic function.  No wall     motion abnormalities are seen.  2. The right ventricle is normal size with normal systolic function.  No     wall motion abnormalities are seen.  3. Both atria are normal size.  There is no atrioseptal defect.  4. The aortic valve is trileaflet, tricommisural with no evidence of     stenosis or regurgitation.  5. The mitral valve is morphologically unremarkable with no stenosis or     regurgitation.  6. The tricuspid valve is morphologically normal with trace insufficiency.     No stenosis is seen.  7. The pulmonic valve was not well seen.  8. The aorta appears normal.  9. Pericardial structures appear normal.  10.      The inferior vena cava is normal.                                               Vida Roller, M.D.    JH/MEDQ  D:  06/01/2002  T:  06/01/2002  Job:  [id number]

## 2010-12-11 ENCOUNTER — Other Ambulatory Visit: Payer: Self-pay | Admitting: Family Medicine

## 2010-12-11 DIAGNOSIS — Z139 Encounter for screening, unspecified: Secondary | ICD-10-CM

## 2010-12-26 ENCOUNTER — Ambulatory Visit (HOSPITAL_COMMUNITY)
Admission: RE | Admit: 2010-12-26 | Discharge: 2010-12-26 | Disposition: A | Discharge status: Home / Self Care | Payer: BC Managed Care – PPO | Source: Ambulatory Visit | Attending: Family Medicine | Admitting: Family Medicine

## 2010-12-26 DIAGNOSIS — Z139 Encounter for screening, unspecified: Secondary | ICD-10-CM

## 2010-12-26 DIAGNOSIS — Z1231 Encounter for screening mammogram for malignant neoplasm of breast: Secondary | ICD-10-CM | POA: Insufficient documentation

## 2010-12-29 ENCOUNTER — Other Ambulatory Visit: Payer: Self-pay | Admitting: Family Medicine

## 2010-12-29 DIAGNOSIS — R928 Other abnormal and inconclusive findings on diagnostic imaging of breast: Secondary | ICD-10-CM

## 2011-01-16 ENCOUNTER — Ambulatory Visit
Admission: RE | Admit: 2011-01-16 | Discharge: 2011-01-16 | Disposition: A | Discharge status: Home / Self Care | Payer: BC Managed Care – PPO | Source: Ambulatory Visit | Attending: Family Medicine | Admitting: Family Medicine

## 2011-01-16 DIAGNOSIS — R928 Other abnormal and inconclusive findings on diagnostic imaging of breast: Secondary | ICD-10-CM

## 2012-03-11 ENCOUNTER — Other Ambulatory Visit: Payer: Self-pay | Admitting: Family Medicine

## 2012-03-11 DIAGNOSIS — Z139 Encounter for screening, unspecified: Secondary | ICD-10-CM

## 2012-04-07 ENCOUNTER — Ambulatory Visit (HOSPITAL_COMMUNITY)
Admission: RE | Admit: 2012-04-07 | Discharge: 2012-04-07 | Disposition: A | Discharge status: Home / Self Care | Payer: BC Managed Care – PPO | Source: Ambulatory Visit | Attending: Family Medicine | Admitting: Family Medicine

## 2012-04-07 DIAGNOSIS — Z139 Encounter for screening, unspecified: Secondary | ICD-10-CM

## 2012-04-07 DIAGNOSIS — Z1231 Encounter for screening mammogram for malignant neoplasm of breast: Secondary | ICD-10-CM | POA: Insufficient documentation

## 2012-06-27 ENCOUNTER — Ambulatory Visit (INDEPENDENT_AMBULATORY_CARE_PROVIDER_SITE_OTHER): Payer: BC Managed Care – PPO | Admitting: Family Medicine

## 2012-06-27 ENCOUNTER — Encounter: Payer: Self-pay | Admitting: Family Medicine

## 2012-06-27 VITALS — BP 178/112 | Temp 98.2°F | Wt 123.2 lb

## 2012-06-27 DIAGNOSIS — F1111 Opioid abuse, in remission: Secondary | ICD-10-CM | POA: Insufficient documentation

## 2012-06-27 DIAGNOSIS — I10 Essential (primary) hypertension: Secondary | ICD-10-CM

## 2012-06-27 DIAGNOSIS — E559 Vitamin D deficiency, unspecified: Secondary | ICD-10-CM

## 2012-06-27 DIAGNOSIS — I471 Supraventricular tachycardia: Secondary | ICD-10-CM | POA: Insufficient documentation

## 2012-06-27 DIAGNOSIS — J309 Allergic rhinitis, unspecified: Secondary | ICD-10-CM

## 2012-06-27 DIAGNOSIS — G43909 Migraine, unspecified, not intractable, without status migrainosus: Secondary | ICD-10-CM | POA: Insufficient documentation

## 2012-06-27 DIAGNOSIS — F1311 Sedative, hypnotic or anxiolytic abuse, in remission: Secondary | ICD-10-CM

## 2012-06-27 DIAGNOSIS — E785 Hyperlipidemia, unspecified: Secondary | ICD-10-CM

## 2012-06-27 MED ORDER — ONDANSETRON HCL 8 MG PO TABS: 8.0000 mg | ORAL_TABLET | Freq: Three times a day (TID) | ORAL | Status: DC | PRN

## 2012-06-27 MED ORDER — KETOCONAZOLE 2 % EX CREA: TOPICAL_CREAM | Freq: Two times a day (BID) | CUTANEOUS | Status: DC

## 2012-06-27 MED ORDER — LISINOPRIL 5 MG PO TABS: 5.0000 mg | ORAL_TABLET | Freq: Every day | ORAL | Status: DC

## 2012-06-27 MED ORDER — HYDROCODONE-ACETAMINOPHEN 5-325 MG PO TABS: 1.0000 | ORAL_TABLET | Freq: Four times a day (QID) | ORAL | Status: DC | PRN

## 2012-06-27 MED ORDER — DOXYCYCLINE HYCLATE 100 MG PO CAPS: 100.0000 mg | ORAL_CAPSULE | Freq: Two times a day (BID) | ORAL | Status: DC

## 2012-06-27 MED ORDER — PROPRANOLOL HCL ER 80 MG PO CP24: 80.0000 mg | ORAL_CAPSULE | Freq: Every day | ORAL | Status: DC

## 2012-06-27 NOTE — Patient Instructions (Signed)
Hypertension As your heart beats, it forces blood through your arteries. This force is your blood pressure. If the pressure is too high, it is called hypertension (HTN) or high blood pressure. HTN is dangerous because you may have it and not know it. High blood pressure may mean that your heart has to work harder to pump blood. Your arteries may be narrow or stiff. The extra work puts you at risk for heart disease, stroke, and other problems.  Blood pressure consists of two numbers, a higher number over a lower, 110/72, for example. It is stated as "110 over 72." The ideal is below 120 for the top number (systolic) and under 80 for the bottom (diastolic). Write down your blood pressure today. You should pay close attention to your blood pressure if you have certain conditions such as:  Heart failure.  Prior heart attack.  Diabetes  Chronic kidney disease.  Prior stroke.  Multiple risk factors for heart disease. To see if you have HTN, your blood pressure should be measured while you are seated with your arm held at the level of the heart. It should be measured at least twice. A one-time elevated blood pressure reading (especially in the Emergency Department) does not mean that you need treatment. There may be conditions in which the blood pressure is different between your right and left arms. It is important to see your caregiver soon for a recheck. Most people have essential hypertension which means that there is not a specific cause. This type of high blood pressure may be lowered by changing lifestyle factors such as:  Stress.  Smoking.  Lack of exercise.  Excessive weight.  Drug/tobacco/alcohol use.  Eating less salt. Most people do not have symptoms from high blood pressure until it has caused damage to the body. Effective treatment can often prevent, delay or reduce that damage. TREATMENT  When a cause has been identified, treatment for high blood pressure is directed at the  cause. There are a large number of medications to treat HTN. These fall into several categories, and your caregiver will help you select the medicines that are best for you. Medications may have side effects. You should review side effects with your caregiver. If your blood pressure stays high after you have made lifestyle changes or started on medicines,   Your medication(s) may need to be changed.  Other problems may need to be addressed.  Be certain you understand your prescriptions, and know how and when to take your medicine.  Be sure to follow up with your caregiver within the time frame advised (usually within two weeks) to have your blood pressure rechecked and to review your medications.  If you are taking more than one medicine to lower your blood pressure, make sure you know how and at what times they should be taken. Taking two medicines at the same time can result in blood pressure that is too low. SEEK IMMEDIATE MEDICAL CARE IF:  You develop a severe headache, blurred or changing vision, or confusion.  You have unusual weakness or numbness, or a faint feeling.  You have severe chest or abdominal pain, vomiting, or breathing problems. MAKE SURE YOU:   Understand these instructions.  Will watch your condition.  Will get help right away if you are not doing well or get worse. Document Released: 02/19/2005 Document Revised: 05/14/2011 Document Reviewed: 10/10/2007 [facility] Patient Information 2013 [place], [place].   Very important to be user doxycycline twice a day for the next 7 days  to cover for the possibility of tick related illness. You can also use Zofran twice a day as needed for the nausea. Please check your blood pressure periodically over the course of the next few weeks I ideally you would see the top number near 1:30 in the bottom number near 80. Start off with half a tablet a blood pressure medicine every single day but if your numbers are still staying  significantly elevated you will need to increase the dose to a full tablet. It is important for you to followup with [place] again in approximately 4-6 weeks we can recheck your blood pressure. In addition to this please do your lab work in the near future he needs to be fasting.  Your diagnoses today are #1 febrile illness possibility of tick related illness. #2 hypertension #3  Migraines #4 hyperlipidemia #5 risk factors for diabetes. As we talked about stay out of work for the full course of this week. The front will provide you with a note.

## 2012-06-27 NOTE — Progress Notes (Signed)
  Subjective:    Patient ID: Heather Cruz, female    DOB: [birth year], 52 y.o.   MRN: [id number]  HPIpatient relates that she's not been feeling good this been going on for the past several days she describes headaches she describes nausea low-grade fevers over the weekend but that rapidly got better now she's not having fevers anymore no sweats or chills just a pounding headache she has a history of migraines she denies nausea or double vision. She does states she doesn't feel good. She relates some muscle aches denies any neck stiffness no sinus pressure no cough or wheezing she does smoke she knows she needs quit. Family history hypertension diabetes heart disease Social history smokes has been counseled to quit    Review of Systems See per above denies sinus congestion no wheezing or difficulty breathing no chest pressure or tightness. Moderate headache. No vomiting or diarrhea. Been unable to work this week.    Objective:   Physical Exam Vital signs noted blood pressure checked several different times best reading was approximately 148/96 Eardrums normal throat is normal neck is supple lungs are clear heart is regular she does have a mild tinea rash at the bottom of her abdomen. Extremities no edema. Skin warm dry.       Assessment & Plan:  Essential hypertension, benign - Plan: Basic metabolic panel  Other and unspecified hyperlipidemia - Plan: Lipid panel  Unspecified vitamin D deficiency - Plan: Vitamin D 25 hydroxy  Febrile illness with possibility of tick-related disease Cox-twice a day for 7 days. In addition to this  Nizoral cream for rash. We also talked about quitting smoking regular activity and following up in 4 weeks for the importance of checking her blood pressure she will start off lisinopril 5 mg tablet half tablet daily for the first couple weeks increase it to a full tablet the goal is get blood pressure 130/80.  She was given a small amount of pain medications for her  migraines with the understanding that we will not renew it.

## 2012-07-03 ENCOUNTER — Encounter: Payer: Self-pay | Admitting: Family Medicine

## 2012-07-03 ENCOUNTER — Ambulatory Visit (INDEPENDENT_AMBULATORY_CARE_PROVIDER_SITE_OTHER): Payer: BC Managed Care – PPO | Admitting: Nurse Practitioner

## 2012-07-03 ENCOUNTER — Encounter: Payer: Self-pay | Admitting: Nurse Practitioner

## 2012-07-03 VITALS — BP 142/90 | Temp 98.6°F | Wt 124.0 lb

## 2012-07-03 DIAGNOSIS — R51 Headache: Secondary | ICD-10-CM

## 2012-07-03 DIAGNOSIS — R5381 Other malaise: Secondary | ICD-10-CM

## 2012-07-03 DIAGNOSIS — R5383 Other fatigue: Secondary | ICD-10-CM

## 2012-07-03 DIAGNOSIS — I10 Essential (primary) hypertension: Secondary | ICD-10-CM

## 2012-07-03 LAB — CBC WITH DIFFERENTIAL/PLATELET
HCT: 38.5 % (ref 36.0–46.0)
Hemoglobin: 12.9 g/dL (ref 12.0–15.0)
Lymphocytes Relative: 33 % (ref 12–46)
Lymphs Abs: 3.3 10*3/uL (ref 0.7–4.0)
Monocytes Absolute: 0.6 10*3/uL (ref 0.1–1.0)
Monocytes Relative: 6 % (ref 3–12)
Neutro Abs: 5.9 10*3/uL (ref 1.7–7.7)
RBC: 4.53 MIL/uL (ref 3.87–5.11)
WBC: 9.9 10*3/uL (ref 4.0–10.5)

## 2012-07-03 NOTE — Patient Instructions (Signed)
[email or web address] [email or web address] Please send BP readings on Lisinopril 10 mg.

## 2012-07-04 ENCOUNTER — Encounter: Payer: Self-pay | Admitting: Nurse Practitioner

## 2012-07-04 LAB — BASIC METABOLIC PANEL
CO2: 27 mEq/L (ref 19–32)
Calcium: 9.2 mg/dL (ref 8.4–10.5)
Sodium: 140 mEq/L (ref 135–145)

## 2012-07-04 LAB — HEPATIC FUNCTION PANEL
ALT: <8 U/L (ref 0–35)
AST: 8 U/L (ref 0–37)
Bilirubin, Direct: 0.1 mg/dL (ref 0.0–0.3)
Indirect Bilirubin: 0.3 mg/dL (ref 0.0–0.9)
Total Bilirubin: 0.4 mg/dL (ref 0.3–1.2)

## 2012-07-04 MED ORDER — LISINOPRIL 5 MG PO TABS: ORAL_TABLET | ORAL | Status: DC

## 2012-07-04 NOTE — Assessment & Plan Note (Signed)
Increase lisinopril to 10 mg daily. Lab work pending.

## 2012-07-04 NOTE — Progress Notes (Signed)
Subjective:  Presents for complaints of elevated blood pressure over the past few weeks. BP over the past 2 days has run 138-172/93-112. Stress level is normal. Denies any OTC meds or supplements. No excessive sodium intake. No edema. No cough. In general has felt bad. Having pressure/headache on the top of her head, no throbbing. Nausea. Vomiting x1. No visual changes. No photosensitivity or phonophobia. No numbness or weakness of the face arms or legs. No difficulty speaking or swallowing. Voiding normal limit. Patient stopped doxycycline given at previous visit due to extreme nausea. No fever or rash. See previous note.  Objective:   BP 142/90  Temp(Src) 98.6 F (37 C) (Oral)  Wt 124 lb (56.246 kg) NAD. Alert, oriented. TMs normal limit. Pharynx clear. Neck supple with minimal adenopathy. Lungs clear. Heart regular rate rhythm. No murmur or gallop noted. Carotids no bruits or thrills. BP on recheck supine 172/100, sitting 154/104 and standing 156/98. Abdomen soft nondistended without obvious organomegaly. Lower extremities no edema.  Assessment:Fatigue - Plan: Basic metabolic panel, CBC with Differential, Hepatic function panel, TSH, Basic metabolic panel, CBC with Differential, Hepatic function panel, TSH  Hypertension - Plan: Basic metabolic panel, Basic metabolic panel  Headache  Plan: Increase lisinopril to 10 mg daily. Lab work pending. Work note given. Recheck BP on new dosage med and send results for our office. Warning signs reviewed. Call or go to ED sooner if any problems.

## 2012-07-07 ENCOUNTER — Encounter: Payer: Self-pay | Admitting: Nurse Practitioner

## 2012-07-09 ENCOUNTER — Emergency Department (HOSPITAL_COMMUNITY): Payer: BC Managed Care – PPO

## 2012-07-09 ENCOUNTER — Other Ambulatory Visit: Payer: Self-pay

## 2012-07-09 ENCOUNTER — Encounter (HOSPITAL_COMMUNITY): Payer: Self-pay | Admitting: Emergency Medicine

## 2012-07-09 ENCOUNTER — Emergency Department (HOSPITAL_COMMUNITY)
Admission: EM | Admit: 2012-07-09 | Discharge: 2012-07-09 | Disposition: A | Discharge status: Home / Self Care | Payer: BC Managed Care – PPO | Attending: Emergency Medicine | Admitting: Emergency Medicine

## 2012-07-09 DIAGNOSIS — F3289 Other specified depressive episodes: Secondary | ICD-10-CM | POA: Insufficient documentation

## 2012-07-09 DIAGNOSIS — F17200 Nicotine dependence, unspecified, uncomplicated: Secondary | ICD-10-CM | POA: Insufficient documentation

## 2012-07-09 DIAGNOSIS — K219 Gastro-esophageal reflux disease without esophagitis: Secondary | ICD-10-CM | POA: Insufficient documentation

## 2012-07-09 DIAGNOSIS — Z8679 Personal history of other diseases of the circulatory system: Secondary | ICD-10-CM | POA: Insufficient documentation

## 2012-07-09 DIAGNOSIS — Z79899 Other long term (current) drug therapy: Secondary | ICD-10-CM | POA: Insufficient documentation

## 2012-07-09 DIAGNOSIS — F329 Major depressive disorder, single episode, unspecified: Secondary | ICD-10-CM | POA: Insufficient documentation

## 2012-07-09 DIAGNOSIS — I10 Essential (primary) hypertension: Secondary | ICD-10-CM | POA: Insufficient documentation

## 2012-07-09 DIAGNOSIS — R079 Chest pain, unspecified: Secondary | ICD-10-CM

## 2012-07-09 HISTORY — DX: Tachycardia, unspecified: R00.0

## 2012-07-09 HISTORY — DX: Dorsalgia, unspecified: M54.9

## 2012-07-09 HISTORY — DX: Other seasonal allergic rhinitis: J30.2

## 2012-07-09 HISTORY — DX: Essential (primary) hypertension: I10

## 2012-07-09 HISTORY — DX: Depression, unspecified: F32.A

## 2012-07-09 HISTORY — DX: Gastro-esophageal reflux disease without esophagitis: K21.9

## 2012-07-09 HISTORY — DX: Major depressive disorder, single episode, unspecified: F32.9

## 2012-07-09 LAB — CBC
HCT: 33.8 % — ABNORMAL LOW (ref 36.0–46.0)
Hemoglobin: 11.6 g/dL — ABNORMAL LOW (ref 12.0–15.0)
MCH: 28.9 pg (ref 26.0–34.0)
MCHC: 34.3 g/dL (ref 30.0–36.0)
MCV: 84.3 fL (ref 78.0–100.0)
RBC: 4.01 MIL/uL (ref 3.87–5.11)

## 2012-07-09 LAB — BASIC METABOLIC PANEL
BUN: 7 mg/dL (ref 6–23)
CO2: 27 mEq/L (ref 19–32)
Calcium: 9 mg/dL (ref 8.4–10.5)
Creatinine, Ser: 0.57 mg/dL (ref 0.50–1.10)
GFR calc non Af Amer: >90 mL/min (ref >90)
Glucose, Bld: 87 mg/dL (ref 70–99)

## 2012-07-09 LAB — POCT I-STAT TROPONIN I: Troponin i, poc: 0 ng/mL (ref 0.00–0.08)

## 2012-07-09 MED ORDER — POTASSIUM CHLORIDE CRYS ER 20 MEQ PO TBCR
40.0000 meq | EXTENDED_RELEASE_TABLET | Freq: Once | ORAL | Status: AC
  Administered 2012-07-09: 40 meq via ORAL
  Filled 2012-07-09: qty 2

## 2012-07-09 MED ORDER — ACETAMINOPHEN 325 MG PO TABS: 650.0000 mg | ORAL_TABLET | Freq: Once | ORAL | Status: DC

## 2012-07-09 NOTE — ED Provider Notes (Signed)
History     CSN: [id number]  Arrival date & time 07/09/12  1831   First MD Initiated Contact with Patient 07/09/12 1915      Chief Complaint  Patient presents with  . Chest Pain    (Consider location/radiation/quality/duration/timing/severity/associated sxs/prior treatment) Patient is a 52 y.o. female presenting with chest pain. The history is provided by the patient.  Chest Pain Associated symptoms: no abdominal pain, no back pain, no cough, no fever, no headache, no palpitations and no shortness of breath   pt indicates at rest today, onset dull pain lower sternal area/midline, also feels in between shoulder blades.  Notes mild nausea earlier, none now. No associated diaphoresis or sob. Denies any unusual fatigue or doe. No other recent similar episodes, no recent cp or discomfort even w exertion. Denies family hx early cad, but states one or two grandparents in older age had problems w their heart. No pleuritic pain. No recent travel, immobility, surgery, cough/hemoptysis, leg swelling, or hx dvt or pe. +smoker. No hx dm. Denies cough or uri c/o. No fever or chills. Hx gerd, denies current heartburn or reflux. Denies hx gallstones.   Past Medical History  Diagnosis Date  . Hypertension   . Tachycardia   . Depression   . Seasonal allergies   . GERD (gastroesophageal reflux disease)   . Back pain     Past Surgical History  Procedure Laterality Date  . Tubal ligation    . Abdominal hysterectomy    . Foot surgery    . Ablation      Family History  Problem Relation Age of Onset  . Hypertension Mother   . Hypertension Father   . Diabetes Father   . Hypertension Brother   . Hypertension Maternal Grandfather   . Heart disease Maternal Grandfather   . Heart disease Paternal Grandmother   . Diabetes Paternal Grandfather   . Stroke Paternal Grandfather     History  Substance Use Topics  . Smoking status: Current Every Day Smoker  . Smokeless tobacco: Not on file  .  Alcohol Use: Yes    OB History   Grav Para Term Preterm Abortions TAB SAB Ect Mult Living                  Review of Systems  Constitutional: Negative for fever and chills.  HENT: Negative for neck pain.   Eyes: Negative for redness.  Respiratory: Negative for cough and shortness of breath.   Cardiovascular: Positive for chest pain. Negative for palpitations and leg swelling.  Gastrointestinal: Negative for abdominal pain.  Genitourinary: Negative for flank pain.  Musculoskeletal: Negative for back pain.  Skin: Negative for rash.  Neurological: Negative for headaches.  Hematological: Does not bruise/bleed easily.  Psychiatric/Behavioral: Negative for confusion.    Allergies  Ciprofloxacin; Penicillins; and Sulfa antibiotics  Home Medications   Current Outpatient Rx  Name  Route  Sig  Dispense  Refill  . calcium-vitamin D (OSCAL WITH D) 500-200 MG-UNIT per tablet   Oral   Take 1 tablet by mouth daily.         . diphenhydrAMINE (BENADRYL) 25 mg capsule   Oral   Take 50 mg by mouth 2 (two) times daily.         Marland Kitchen doxycycline (VIBRAMYCIN) 100 MG capsule   Oral   Take 1 capsule (100 mg total) by mouth 2 (two) times daily.   20 capsule   0   . DULoxetine (CYMBALTA) 60 MG capsule  Oral   Take 60 mg by mouth daily.         Marland Kitchen estrogens, conjugated, (PREMARIN) 0.45 MG tablet   Oral   Take 0.45 mg by mouth daily.         Marland Kitchen HYDROcodone-acetaminophen (NORCO/VICODIN) 5-325 MG per tablet   Oral   Take 1 tablet by mouth every 6 (six) hours as needed for pain.   10 tablet   0   . ketoconazole (NIZORAL) 2 % cream   Topical   Apply topically 2 (two) times daily.   30 g   4   . lisinopril (PRINIVIL,ZESTRIL) 5 MG tablet   Oral   Take 10 mg by mouth daily.         . ondansetron (ZOFRAN) 8 MG tablet   Oral   Take 1 tablet (8 mg total) by mouth every 8 (eight) hours as needed for nausea.   20 tablet   0   . propranolol ER (INDERAL LA) 80 MG 24 hr capsule    Oral   Take 1 capsule (80 mg total) by mouth daily.   30 capsule   6     May fill early   . ranitidine (ZANTAC) 150 MG capsule   Oral   Take 150 mg by mouth 2 (two) times daily.           BP 185/98  Pulse 86  Temp(Src) 98.4 F (36.9 C) (Oral)  Resp 14  SpO2 98%  Physical Exam  Nursing note and vitals reviewed. Constitutional: She is oriented to person, place, and time. She appears well-developed and well-nourished. No distress.  HENT:  Mouth/Throat: Oropharynx is clear and moist.  Eyes: Conjunctivae are normal. No scleral icterus.  Neck: Neck supple. No tracheal deviation present.  Cardiovascular: Normal rate, regular rhythm, normal heart sounds and intact distal pulses.  Exam reveals no gallop and no friction rub.   No murmur heard. Pulmonary/Chest: Effort normal and breath sounds normal. No respiratory distress. She exhibits no tenderness.  Abdominal: Soft. Normal appearance and bowel sounds are normal. She exhibits no distension. There is no tenderness.  Musculoskeletal: She exhibits no edema and no tenderness.  Neurological: She is alert and oriented to person, place, and time.  Skin: Skin is warm and dry. No rash noted.  Psychiatric:  Sl anxious    ED Course  Procedures (including critical care time)   Results for orders placed during the hospital encounter of 07/09/12  CBC      Result Value Range   WBC 11.4 (*) 4.0 - 10.5 K/uL   RBC 4.01  3.87 - 5.11 MIL/uL   Hemoglobin 11.6 (*) 12.0 - 15.0 g/dL   HCT 40.9 (*) 81.1 - 91.4 %   MCV 84.3  78.0 - 100.0 fL   MCH 28.9  26.0 - 34.0 pg   MCHC 34.3  30.0 - 36.0 g/dL   RDW 78.2  95.6 - 21.3 %   Platelets 314  150 - 400 K/uL  POCT I-STAT TROPONIN I      Result Value Range   Troponin i, poc 0.00  0.00 - 0.08 ng/mL   Comment 3            Dg Chest Port 1 View  07/09/2012  *RADIOLOGY REPORT*  Clinical Data: Chest pain  PORTABLE CHEST - 1 VIEW  Comparison: 03/10/2008  Findings: 1902 hours.  No edema or focal  airspace consolidation. No evidence for pneumothorax or pleural effusion. The cardiopericardial silhouette is within normal  limits for size. Small to moderate hiatal hernia again noted. Telemetry leads overlie the chest.  IMPRESSION: Stable exam.  No findings to explain the patient's history of pain.  Hiatal hernia.   Original Report Authenticated By: Kennith Center, M.D.        MDM  Labs. Cxr.   Reviewed nursing notes and prior charts for additional history.    Date: 07/09/2012  Rate: 89  Rhythm: normal sinus rhythm  QRS Axis: normal  Intervals: normal  ST/T Wave abnormalities: nonspecific ST/T changes  Conduction Disutrbances:none  Narrative Interpretation:   Old EKG Reviewed: none available  Labs and ultrasound pending - discussed w pa P Dammen tentative plan, if labs, and u/s neg, anticipate will be able to be discharged.  Given atypical cp, would give referral to [facility] to f/u this week.  If uncomplicated gallstones, referral to [facility].          Suzi Roots, MD 07/09/12 2007

## 2012-07-09 NOTE — ED Provider Notes (Signed)
Brand Siever S 8:00 PM patient discussed in sign out with Dr. Denton Lank. Patient with atypical chest pain possibly biliary in origin. Lab tests and ultrasound of abdomen pending. EKG without any acute concerning findings. Chest x-ray unremarkable. Negative troponin. Plan to discharge with outpatient cardiology followup if remaining lab tests and ultrasound unremarkable.   Patient reports having slight headache. Tylenol ordered. Patient otherwise feeling well.  Ultrasound unremarkable. Remaining lab tests also without any significant or concerning findings. At this time we'll discharge home to followup with PCP and cardiology. Patient agrees with this plan. She has been given strict return precautions.  Angus Seller, PA-C 07/09/12 2109

## 2012-07-09 NOTE — ED Notes (Signed)
Pt reports tightness across chest, sob, nausea, diaphoresis, dizziness, and pain between shoulder blades since 5pm.  States pain started after she got off work and was waiting for the bus.  Pain has remained constant.  Also reports 2 episodes (lasting 15 min) of difficulty with vision- on Monday and today.  States she could see with peripheral vision but could not see anything in the center of her vision field.  Started taking htn medication on 4/25.

## 2012-07-15 ENCOUNTER — Telehealth: Payer: Self-pay | Admitting: Family Medicine

## 2012-07-15 MED ORDER — LISINOPRIL 10 MG PO TABS: 10.0000 mg | ORAL_TABLET | Freq: Every day | ORAL | Status: DC

## 2012-07-15 NOTE — Telephone Encounter (Signed)
RX sent into [facility] (lisinopril 10 mg #30 1 po qd) per patient request. Patient was notified.

## 2012-07-15 NOTE — Telephone Encounter (Signed)
Patient needs a refill of lisinopril to [facility] on [address]

## 2012-07-25 ENCOUNTER — Encounter: Payer: Self-pay | Admitting: *Deleted

## 2012-07-29 ENCOUNTER — Encounter: Payer: Self-pay | Admitting: Family Medicine

## 2012-07-29 ENCOUNTER — Ambulatory Visit (INDEPENDENT_AMBULATORY_CARE_PROVIDER_SITE_OTHER): Payer: BC Managed Care – PPO | Admitting: Family Medicine

## 2012-07-29 VITALS — BP 104/76 | HR 80 | Ht 61.0 in | Wt 129.0 lb

## 2012-07-29 DIAGNOSIS — I10 Essential (primary) hypertension: Secondary | ICD-10-CM

## 2012-07-29 MED ORDER — LISINOPRIL 10 MG PO TABS: 10.0000 mg | ORAL_TABLET | Freq: Every day | ORAL | Status: DC

## 2012-07-29 NOTE — Progress Notes (Signed)
  Subjective:    Patient ID: Heather Cruz, female    DOB: 09/05/1960, 52 y.o.   MRN: [id number]  Hypertension This is a chronic problem. The current episode started more than 1 year ago. The problem has been gradually improving since onset. The problem is controlled. There are no associated agents to hypertension. Treatments tried: lisinopril. The current treatment provides significant improvement. There are no compliance problems.   When she initially started the increased dose of lisinopril she states her blood pressure stayed up she went to the ER because of some chest pains she was evaluated and  Tests came back looking good. She states she now feels much better. I talked with her at length about smoking and encourage her to quit counselor to quit at this present moment she does not want to Family history noncontributory    Review of Systems Negative for chest pain shortness of breath    Objective:   Physical Exam Lungs are clear heart is regular pulse normal blood pressure is good when I checked it 132/80  Extremities no edema     Assessment & Plan:  HTN-lisinopril continue currently refills given Talked at length about the importance of keeping risk factors for heart disease under control. Encourage her to quit smoking. Followup 6 months.

## 2012-10-24 ENCOUNTER — Other Ambulatory Visit: Payer: Self-pay | Admitting: Family Medicine

## 2012-12-09 ENCOUNTER — Ambulatory Visit: Payer: BC Managed Care – PPO | Admitting: Family Medicine

## 2012-12-10 ENCOUNTER — Encounter: Payer: Self-pay | Admitting: Nurse Practitioner

## 2012-12-10 ENCOUNTER — Encounter: Payer: Self-pay | Admitting: Family Medicine

## 2012-12-10 ENCOUNTER — Ambulatory Visit (INDEPENDENT_AMBULATORY_CARE_PROVIDER_SITE_OTHER): Payer: BC Managed Care – PPO | Admitting: Nurse Practitioner

## 2012-12-10 VITALS — BP 130/90 | Temp 98.5°F | Ht 61.5 in | Wt 125.5 lb

## 2012-12-10 DIAGNOSIS — J069 Acute upper respiratory infection, unspecified: Secondary | ICD-10-CM

## 2012-12-10 MED ORDER — CEFUROXIME AXETIL 500 MG PO TABS: 500.0000 mg | ORAL_TABLET | Freq: Two times a day (BID) | ORAL | Status: DC

## 2012-12-10 MED ORDER — HYDROCODONE-HOMATROPINE 5-1.5 MG/5ML PO SYRP: 5.0000 mL | ORAL_SOLUTION | ORAL | Status: DC | PRN

## 2012-12-11 ENCOUNTER — Encounter: Payer: Self-pay | Admitting: Nurse Practitioner

## 2012-12-11 NOTE — Progress Notes (Signed)
Subjective:  Presents complaints of congestion and cough for the past one to 2 weeks. No fever. Frequent cough producing green sputum. Green nasal drainage. Slight wheezing with prolonged cough. Sore throat. Slight right ear pain. No headache. Smokes one pack per day.  Objective:   BP 130/90  Temp(Src) 98.5 F (36.9 C) (Oral)  Ht 5' 1.5" (1.562 m)  Wt 125 lb 8 oz (56.926 kg)  BMI 23.33 kg/m2 NAD. Alert, oriented. TMs clear effusion, no erythema. Pharynx injected with green PND noted. Neck supple with mild soft nontender adenopathy. Lungs clear. Occasional productive cough noted. Heart regular rate rhythm.  Assessment:Acute upper respiratory infections of unspecified site  Plan: Meds ordered this encounter  Medications  . cefUROXime (CEFTIN) 500 MG tablet    Sig: Take 1 tablet (500 mg total) by mouth 2 (two) times daily.    Dispense:  20 tablet    Refill:  0    Order Specific Question:  Supervising Provider    Answer:  Merlyn Albert [[id number]]  . HYDROcodone-homatropine (HYCODAN) 5-1.5 MG/5ML syrup    Sig: Take 5 mLs by mouth every 4 (four) hours as needed.    Dispense:  120 mL    Refill:  0    Order Specific Question:  Supervising Provider    Answer:  Merlyn Albert [[id number]]   Continue Mucinex as directed. Call back if symptoms worsen or persist. Defers need for albuterol at this time.

## 2012-12-16 ENCOUNTER — Other Ambulatory Visit: Payer: Self-pay | Admitting: *Deleted

## 2012-12-16 ENCOUNTER — Telehealth: Payer: Self-pay | Admitting: *Deleted

## 2012-12-16 MED ORDER — CEPHALEXIN 500 MG PO CAPS: 500.0000 mg | ORAL_CAPSULE | Freq: Three times a day (TID) | ORAL | Status: DC

## 2012-12-16 NOTE — Telephone Encounter (Signed)
Discussed with patient. Med sent to pharm.  

## 2012-12-16 NOTE — Telephone Encounter (Signed)
Keflex 500 tid ten d 

## 2012-12-16 NOTE — Telephone Encounter (Signed)
Antibiotic that she was put on last week not working. It gave her nausea so she had to stop med. Still has sinus congestion, cough, feels cold and hot, nausea, no wheezing, [facility] [address]

## 2013-03-19 ENCOUNTER — Emergency Department (HOSPITAL_COMMUNITY)
Admission: EM | Admit: 2013-03-19 | Discharge: 2013-03-19 | Discharge status: Against Medical Advice | Payer: Medicaid Other

## 2013-03-19 ENCOUNTER — Encounter (HOSPITAL_COMMUNITY): Payer: Self-pay | Admitting: Emergency Medicine

## 2013-03-19 NOTE — ED Notes (Signed)
Pt slipped on ice and fell today. C/o lower back and left knee/foot pain. Denies hitting head/loc. nad noted.

## 2013-03-19 NOTE — ED Notes (Signed)
All information for 1/15 /2015 entered in error.  Pt was mistakenly registered into ER.

## 2013-03-24 ENCOUNTER — Other Ambulatory Visit: Payer: Self-pay | Admitting: Family Medicine

## 2013-03-25 ENCOUNTER — Other Ambulatory Visit: Payer: Self-pay | Admitting: Family Medicine

## 2013-03-26 ENCOUNTER — Encounter: Payer: Self-pay | Admitting: Family Medicine

## 2013-03-26 ENCOUNTER — Ambulatory Visit (INDEPENDENT_AMBULATORY_CARE_PROVIDER_SITE_OTHER): Payer: BC Managed Care – PPO | Admitting: Nurse Practitioner

## 2013-03-26 ENCOUNTER — Encounter: Payer: Self-pay | Admitting: Nurse Practitioner

## 2013-03-26 VITALS — BP 130/90 | Temp 98.5°F | Ht 61.75 in | Wt 130.4 lb

## 2013-03-26 DIAGNOSIS — J0100 Acute maxillary sinusitis, unspecified: Secondary | ICD-10-CM

## 2013-03-26 MED ORDER — CEPHALEXIN 500 MG PO CAPS: 500.0000 mg | ORAL_CAPSULE | Freq: Three times a day (TID) | ORAL | Status: DC

## 2013-04-02 ENCOUNTER — Encounter: Payer: Self-pay | Admitting: Nurse Practitioner

## 2013-04-02 NOTE — Progress Notes (Signed)
Subjective:  C/o cough and congestion x 1 week. No fever. Occasional cough. Green mucus. Right sided maxillary area headache. Scratchy throat. Ear pressure. No vomiting diarrhea or abdominal pain. No wheezing.  Objective:   BP 130/90  Temp(Src) 98.5 F (36.9 C)  Ht 5' 1.75" (1.568 m)  Wt 130 lb 6.4 oz (59.149 kg)  BMI 24.06 kg/m2 NAD. Alert, oriented. TMs clear effusion, no erythema. Pharynx injected with green PND noted. Neck supple with mild soft anterior adenopathy. Lungs clear. Heart regular rhythm.  Assessment: Acute maxillary sinusitis   Plan: Meds ordered this encounter  Medications  . cephALEXin (KEFLEX) 500 MG capsule    Sig: Take 1 capsule (500 mg total) by mouth 3 (three) times daily.    Dispense:  30 capsule    Refill:  0    Order Specific Question:  Supervising Provider    Answer:  Merlyn AlbertLUKING, WILLIAM S [[id number]]   OTC meds as directed for congestion. Call back if worsens or persists.

## 2013-04-03 ENCOUNTER — Encounter: Payer: Self-pay | Admitting: Family Medicine

## 2013-04-03 ENCOUNTER — Telehealth: Payer: Self-pay | Admitting: Family Medicine

## 2013-04-03 NOTE — Telephone Encounter (Signed)
Notified patient letter was ready. I faxed to her employer.

## 2013-04-03 NOTE — Telephone Encounter (Signed)
Pt requesting a work note for this week, been home with a stomach bug, pt out of work 03/30/13 to return to work 04/06/13, please call when ready [phone]

## 2013-04-03 NOTE — Telephone Encounter (Signed)
plz give but if needing to be out greater then NTBS

## 2013-04-09 ENCOUNTER — Other Ambulatory Visit: Payer: Self-pay | Admitting: Family Medicine

## 2013-04-09 DIAGNOSIS — Z139 Encounter for screening, unspecified: Secondary | ICD-10-CM

## 2013-04-13 ENCOUNTER — Ambulatory Visit (HOSPITAL_COMMUNITY)
Admission: RE | Admit: 2013-04-13 | Discharge: 2013-04-13 | Disposition: A | Discharge status: Home / Self Care | Payer: BC Managed Care – PPO | Source: Ambulatory Visit | Attending: Family Medicine | Admitting: Family Medicine

## 2013-04-13 DIAGNOSIS — Z1231 Encounter for screening mammogram for malignant neoplasm of breast: Secondary | ICD-10-CM | POA: Insufficient documentation

## 2013-04-13 DIAGNOSIS — Z139 Encounter for screening, unspecified: Secondary | ICD-10-CM

## 2013-04-29 ENCOUNTER — Encounter: Payer: Self-pay | Admitting: Family Medicine

## 2013-04-29 ENCOUNTER — Telehealth: Payer: Self-pay | Admitting: Family Medicine

## 2013-04-29 NOTE — Telephone Encounter (Signed)
Notified patient that note ready.

## 2013-04-29 NOTE — Telephone Encounter (Signed)
We'll do it but let her know sometime workplaces require further documentation from docs for 7 days off,and we can't provide any without a visit

## 2013-04-29 NOTE — Telephone Encounter (Signed)
Patient called this morning wanting work excuse for stomach virus and running fever but hasnt been seen in office for this. She wants note from 2/18-2/25 returning to work 2/26 if not running fever.

## 2013-05-06 ENCOUNTER — Telehealth: Payer: Self-pay | Admitting: Family Medicine

## 2013-05-06 NOTE — Telephone Encounter (Signed)
Patient notified and verbalized understanding. Transferred up front to make appt.  

## 2013-05-06 NOTE — Telephone Encounter (Signed)
appt first?

## 2013-05-06 NOTE — Telephone Encounter (Signed)
Patient says that she went to see her back doctor at [facility], Dr Manon HildingBodea, and she said that the pain that patient has described, it sounds like she may have gallstones and patient would like to know if she needs to go for an U/S or come in for an appointment.

## 2013-05-12 ENCOUNTER — Encounter: Payer: Self-pay | Admitting: Family Medicine

## 2013-05-12 ENCOUNTER — Ambulatory Visit (INDEPENDENT_AMBULATORY_CARE_PROVIDER_SITE_OTHER): Payer: BC Managed Care – PPO | Admitting: Family Medicine

## 2013-05-12 VITALS — BP 122/86 | Temp 98.6°F | Ht 61.75 in | Wt 130.0 lb

## 2013-05-12 DIAGNOSIS — M549 Dorsalgia, unspecified: Secondary | ICD-10-CM

## 2013-05-12 DIAGNOSIS — R109 Unspecified abdominal pain: Secondary | ICD-10-CM

## 2013-05-12 LAB — POCT URINALYSIS DIPSTICK
NITRITE UA: POSITIVE
PH UA: 6.5
Spec Grav, UA: <=1.005

## 2013-05-12 NOTE — Progress Notes (Signed)
   Subjective:    Patient ID: Heather Cruz, female    DOB: [birth year], 53 y.o.   MRN: [id number]  HPI Patient is here today d/t back pain, shoulder, and abdominal pain. She does see a back doctor and they told her to see her family physician to r/o gallstones. The pain started March 1st.   Pt denies any type of urinary symptoms.  Pt is getting over a recent virus to where she was dry-heaving at the end of Feb.   No other particular problems no high fever no wheezing no rectal bleeding. Patient had ultrasound year ago was normal.  Review of Systems See above.    Objective:   Physical Exam  Lungs are clear heart is regular flanks nontender abdomen soft no guarding rebound extremities no edema skin warm dry There is some mild upper tenderness in the right upper quadrant.     Assessment & Plan:  Abdominal pain needs ultrasound if ultrasound negative then HIDA. If HIDA negative then possible EGD. Also lab work ordered as well.

## 2013-05-13 ENCOUNTER — Ambulatory Visit (HOSPITAL_COMMUNITY)
Admission: RE | Admit: 2013-05-13 | Discharge: 2013-05-13 | Disposition: A | Discharge status: Home / Self Care | Payer: BC Managed Care – PPO | Source: Ambulatory Visit | Attending: Family Medicine | Admitting: Family Medicine

## 2013-05-13 DIAGNOSIS — R109 Unspecified abdominal pain: Secondary | ICD-10-CM | POA: Insufficient documentation

## 2013-05-13 LAB — CBC WITH DIFFERENTIAL/PLATELET
BASOS PCT: 1 % (ref 0–1)
Basophils Absolute: 0.1 10*3/uL (ref 0.0–0.1)
EOS ABS: 0.2 10*3/uL (ref 0.0–0.7)
EOS PCT: 2 % (ref 0–5)
HCT: 36.5 % (ref 36.0–46.0)
HEMOGLOBIN: 12.1 g/dL (ref 12.0–15.0)
Lymphocytes Relative: 26 % (ref 12–46)
Lymphs Abs: 2.8 10*3/uL (ref 0.7–4.0)
MCH: 27.8 pg (ref 26.0–34.0)
MCHC: 33.2 g/dL (ref 30.0–36.0)
MCV: 83.7 fL (ref 78.0–100.0)
MONOS PCT: 5 % (ref 3–12)
Monocytes Absolute: 0.5 10*3/uL (ref 0.1–1.0)
NEUTROS PCT: 66 % (ref 43–77)
Neutro Abs: 7.2 10*3/uL (ref 1.7–7.7)
PLATELETS: 359 10*3/uL (ref 150–400)
RBC: 4.36 MIL/uL (ref 3.87–5.11)
RDW: 14.4 % (ref 11.5–15.5)
WBC: 10.9 10*3/uL — ABNORMAL HIGH (ref 4.0–10.5)

## 2013-05-13 LAB — LIPASE: Lipase: 19 U/L (ref 0–75)

## 2013-05-13 LAB — HEPATIC FUNCTION PANEL
ALBUMIN: 3.9 g/dL (ref 3.5–5.2)
ALT: 12 U/L (ref 0–35)
AST: 16 U/L (ref 0–37)
Alkaline Phosphatase: 93 U/L (ref 39–117)
BILIRUBIN TOTAL: 0.3 mg/dL (ref 0.2–1.2)
Bilirubin, Direct: <0.1 mg/dL (ref 0.0–0.3)
TOTAL PROTEIN: 6.6 g/dL (ref 6.0–8.3)

## 2013-05-13 LAB — BASIC METABOLIC PANEL
BUN: 7 mg/dL (ref 6–23)
CALCIUM: 9.4 mg/dL (ref 8.4–10.5)
CO2: 28 meq/L (ref 19–32)
Chloride: 106 mEq/L (ref 96–112)
Creat: 0.49 mg/dL — ABNORMAL LOW (ref 0.50–1.10)
Glucose, Bld: 99 mg/dL (ref 70–99)
Potassium: 4.2 mEq/L (ref 3.5–5.3)
SODIUM: 141 meq/L (ref 135–145)

## 2013-05-14 ENCOUNTER — Other Ambulatory Visit: Payer: Self-pay | Admitting: *Deleted

## 2013-05-14 DIAGNOSIS — R1011 Right upper quadrant pain: Secondary | ICD-10-CM

## 2013-05-18 ENCOUNTER — Encounter (HOSPITAL_COMMUNITY): Payer: BC Managed Care – PPO

## 2013-05-27 ENCOUNTER — Encounter (HOSPITAL_COMMUNITY)
Admission: RE | Admit: 2013-05-27 | Discharge: 2013-05-27 | Disposition: A | Discharge status: Home / Self Care | Payer: BC Managed Care – PPO | Source: Ambulatory Visit | Attending: Family Medicine | Admitting: Family Medicine

## 2013-05-27 ENCOUNTER — Encounter (HOSPITAL_COMMUNITY): Payer: Self-pay

## 2013-05-27 DIAGNOSIS — R1011 Right upper quadrant pain: Secondary | ICD-10-CM | POA: Insufficient documentation

## 2013-05-27 MED ORDER — SINCALIDE 5 MCG IJ SOLR
INTRAMUSCULAR | Status: AC
  Administered 2013-05-27: 1.18 ug via INTRAVENOUS
  Filled 2013-05-27: qty 5

## 2013-05-27 MED ORDER — STERILE WATER FOR INJECTION IJ SOLN
INTRAMUSCULAR | Status: AC
  Administered 2013-05-27: 1.18 mL via INTRAVENOUS
  Filled 2013-05-27: qty 10

## 2013-05-27 MED ORDER — TECHNETIUM TC 99M MEBROFENIN IV KIT
5.0000 | PACK | Freq: Once | INTRAVENOUS | Status: AC | PRN
Start: 2013-05-27 — End: 2013-05-27
  Administered 2013-05-27: 5 via INTRAVENOUS

## 2013-05-29 ENCOUNTER — Other Ambulatory Visit: Payer: Self-pay

## 2013-05-29 DIAGNOSIS — R109 Unspecified abdominal pain: Secondary | ICD-10-CM

## 2013-06-01 ENCOUNTER — Encounter: Payer: Self-pay | Admitting: Gastroenterology

## 2013-06-04 ENCOUNTER — Encounter: Payer: Self-pay | Admitting: Gastroenterology

## 2013-06-04 ENCOUNTER — Ambulatory Visit (INDEPENDENT_AMBULATORY_CARE_PROVIDER_SITE_OTHER): Payer: BC Managed Care – PPO | Admitting: Gastroenterology

## 2013-06-04 VITALS — BP 143/99 | HR 88 | Temp 97.2°F | Ht 61.0 in | Wt 131.8 lb

## 2013-06-04 DIAGNOSIS — R1011 Right upper quadrant pain: Secondary | ICD-10-CM

## 2013-06-04 DIAGNOSIS — Z1211 Encounter for screening for malignant neoplasm of colon: Secondary | ICD-10-CM

## 2013-06-04 MED ORDER — PEG 3350-KCL-NA BICARB-NACL 420 G PO SOLR: 4000.0000 mL | ORAL | Status: DC

## 2013-06-04 NOTE — Progress Notes (Signed)
Referring Provider: Merlyn Cruz, Heather S, MD Primary Care Physician:  Harlow AsaLUKING,W S, MD Primary Gastroenterologist:  Dr. Jena Gaussourk   Chief Complaint  Patient presents with  . Abdominal Pain    x 1 month  . Nausea    at times    HPI:   Heather Cruz presents today at the request of Dr. Gerda DissLuking secondary to abdominal pain. About a month ago experienced onset of nausea, RUQ pain radiating to right posterior shoulder. Noted as intermittent, usually worsened after eating. States she hurt significantly after HIDA scan. US of abdomen with prominent liver with mild fatty change. HIDA with EF 67%. Denies changes in eating habits. States over past few years has been dealing with intermittent nausea and diarrhea. No dysphagia. Takes Zantac BID, with good control of indigestion. Thinks may have IBS. Sometimes may have to run to bathroom after eating but for the past few months has been back to her baseline. Will have spans of time with severe nausea and loose stools for 3-4 days at a time. No rectal bleeding. No melena. No unexplained weight loss.   No prior colonoscopy or EGD. No NSAIDs or aspirin powders.   Past Medical History  Diagnosis Date  . Hypertension   . Tachycardia   . Depression   . Seasonal allergies   . GERD (gastroesophageal reflux disease)   . Back pain   . Migraine   . Perennial allergic rhinitis   . Hyperlipidemia   . PSVT (paroxysmal supraventricular tachycardia)   . Sleep apnea   . Substance abuse     pain medication    Past Surgical History  Procedure Laterality Date  . Tubal ligation    . Abdominal hysterectomy    . Foot surgery    . Cardiac catheterization      and ablation    Current Outpatient Prescriptions  Medication Sig Dispense Refill  . calcium-vitamin D (OSCAL WITH D) 500-200 MG-UNIT per tablet Take 1 tablet by mouth daily.      . diphenhydrAMINE (BENADRYL) 25 mg capsule Take 50 mg by mouth 2 (two) times daily.      . DULoxetine (CYMBALTA) 60 MG  capsule TAKE ONE CAPSULE EVERY DAY  90 capsule  0  . HYDROcodone-acetaminophen (NORCO/VICODIN) 5-325 MG per tablet Take 1 tablet by mouth every 6 (six) hours as needed for pain.  10 tablet  0  . propranolol ER (INDERAL LA) 80 MG 24 hr capsule TAKE ONE CAPSULE EVERY DAY  90 capsule  0  . ranitidine (ZANTAC) 150 MG capsule Take 150 mg by mouth 2 (two) times daily.       Marland Kitchen. estrogens, conjugated, (PREMARIN) 0.45 MG tablet Take 0.45 mg by mouth daily.      Marland Kitchen. gemfibrozil (LOPID) 600 MG tablet Take 600 mg by mouth 2 (two) times daily before a meal.       No current facility-administered medications for this visit.    Allergies as of 06/04/2013 - Review Complete 06/04/2013  Allergen Reaction Noted  . Ciprofloxacin Nausea Only 06/27/2012  . Doxycycline Nausea Only 07/09/2012  . Penicillins Hives 06/27/2012  . Sulfa antibiotics Nausea Only 06/27/2012    Family History  Problem Relation Age of Onset  . Hypertension Mother   . Hypertension Father   . Diabetes Father   . Hypertension Brother   . Hypertension Maternal Grandfather   . Heart disease Maternal Grandfather   . Heart disease Paternal Grandmother   . Diabetes Paternal Grandfather   .  Stroke Paternal Grandfather   . Colon cancer Neg Hx     History   Social History  . Marital Status: Divorced    Spouse Name: N/A    Number of Children: N/A  . Years of Education: N/A   Occupational History  . Not on file.   Social History Main Topics  . Smoking status: Current Every Day Smoker -- 1.00 packs/day for 30 years    Types: Cigarettes  . Smokeless tobacco: Not on file  . Alcohol Use: Yes     Comment: occasionally  . Drug Use: No  . Sexual Activity: Not on file   Other Topics Concern  . Not on file   Social History Narrative  . No narrative on file    Review of Systems: As mentioned in HPI.   Physical Exam: BP 143/99  Pulse 88  Temp(Src) 97.2 F (36.2 C) (Oral)  Ht 5\' 1"  (1.549 m)  Wt 131 lb 12.8 oz (59.784 kg)   BMI 24.92 kg/m2 General:   Alert and oriented. Well-developed, well-nourished, pleasant and cooperative. Head:  Normocephalic and atraumatic. Eyes:  Conjunctiva pink, sclera clear, no icterus.   Conjunctiva pink. Ears:  Normal auditory acuity. Nose:  No deformity, discharge,  or lesions. Mouth:  No deformity or lesions, mucosa pink and moist.  Neck:  Supple, without mass or thyromegaly. Lungs:  Clear to auscultation bilaterally, without wheezing, rales, or rhonchi.  Heart:  S1, S2 present without murmurs noted.  Abdomen:  +BS, soft, RUQ discomfort and non-distended. Without mass or HSM. No rebound or guarding. No hernias noted. Rectal:  Deferred  Msk:  Symmetrical without gross deformities. Normal posture. Extremities:  Without clubbing or edema. Neurologic:  Alert and  oriented x4;  grossly normal neurologically. Skin:  Intact, warm and dry without significant lesions or rashes Cervical Nodes:  No significant cervical adenopathy. Psych:  Alert and cooperative. Normal mood and affect.   Lab Results  Component Value Date   WBC 10.9* 05/12/2013   HGB 12.1 05/12/2013   HCT 36.5 05/12/2013   MCV 83.7 05/12/2013   PLT 359 05/12/2013   Lab Results  Component Value Date   LIPASE 19 05/12/2013   Lab Results  Component Value Date   ALT 12 05/12/2013   AST 16 05/12/2013   ALKPHOS 93 05/12/2013   BILITOT 0.3 05/12/2013

## 2013-06-04 NOTE — Patient Instructions (Signed)
We have scheduled you for a screening colonoscopy and upper endoscopy with Dr. Jena Gaussourk in the near future.  Further recommendations to follow!

## 2013-06-05 ENCOUNTER — Encounter (HOSPITAL_COMMUNITY): Payer: Self-pay | Admitting: Pharmacy Technician

## 2013-06-08 NOTE — Assessment & Plan Note (Signed)
Needs initial screening colonoscopy. Self-endorsed history of IBS with occasional episodes of loose stool. No rectal bleeding or FH of colon cancer.   Proceed with [facility]CS at time of EGD with Dr. Jena Gaussourk in near future: the risks, benefits, and alternatives have been discussed with the patient in detail. The patient states understanding and desires to proceed.

## 2013-06-08 NOTE — Assessment & Plan Note (Signed)
53 year old female with month long history of RUQ pain, associated with eating and vague reports of nausea. No melena, dysphagia, unexplained weight loss. Zantac BID for indigestion but no PPI. US of abdomen without gallstones, HIDA with EF 67%. No reproduction of pain during HIDA, but patient does endorse pain shortly after HIDA. Needs EGD to rule out occult GI issue such as gastritis, PUD; if negative, would offer referral for consideration of elective cholecystectomy.   Proceed with upper endoscopy in the near future with Dr. Jena Gaussourk. The risks, benefits, and alternatives have been discussed in detail with patient. They have stated understanding and desire to proceed.

## 2013-06-09 NOTE — Progress Notes (Signed)
cc'd to pcp 

## 2013-06-15 ENCOUNTER — Encounter (HOSPITAL_COMMUNITY)
Admission: RE | Disposition: A | Discharge status: Home / Self Care | Payer: Self-pay | Source: Ambulatory Visit | Attending: Internal Medicine

## 2013-06-15 ENCOUNTER — Ambulatory Visit (HOSPITAL_COMMUNITY)
Admission: RE | Admit: 2013-06-15 | Discharge: 2013-06-15 | Disposition: A | Discharge status: Home / Self Care | Payer: BC Managed Care – PPO | Source: Ambulatory Visit | Attending: Internal Medicine | Admitting: Internal Medicine

## 2013-06-15 ENCOUNTER — Encounter (HOSPITAL_COMMUNITY): Payer: Self-pay | Admitting: *Deleted

## 2013-06-15 DIAGNOSIS — E785 Hyperlipidemia, unspecified: Secondary | ICD-10-CM | POA: Insufficient documentation

## 2013-06-15 DIAGNOSIS — K449 Diaphragmatic hernia without obstruction or gangrene: Secondary | ICD-10-CM | POA: Insufficient documentation

## 2013-06-15 DIAGNOSIS — Q391 Atresia of esophagus with tracheo-esophageal fistula: Secondary | ICD-10-CM

## 2013-06-15 DIAGNOSIS — K222 Esophageal obstruction: Secondary | ICD-10-CM | POA: Insufficient documentation

## 2013-06-15 DIAGNOSIS — K648 Other hemorrhoids: Secondary | ICD-10-CM | POA: Insufficient documentation

## 2013-06-15 DIAGNOSIS — F3289 Other specified depressive episodes: Secondary | ICD-10-CM | POA: Insufficient documentation

## 2013-06-15 DIAGNOSIS — F329 Major depressive disorder, single episode, unspecified: Secondary | ICD-10-CM | POA: Insufficient documentation

## 2013-06-15 DIAGNOSIS — Q393 Congenital stenosis and stricture of esophagus: Secondary | ICD-10-CM

## 2013-06-15 DIAGNOSIS — I10 Essential (primary) hypertension: Secondary | ICD-10-CM | POA: Insufficient documentation

## 2013-06-15 DIAGNOSIS — Z1211 Encounter for screening for malignant neoplasm of colon: Secondary | ICD-10-CM | POA: Insufficient documentation

## 2013-06-15 DIAGNOSIS — Z79899 Other long term (current) drug therapy: Secondary | ICD-10-CM | POA: Insufficient documentation

## 2013-06-15 DIAGNOSIS — R1011 Right upper quadrant pain: Secondary | ICD-10-CM | POA: Insufficient documentation

## 2013-06-15 DIAGNOSIS — F17200 Nicotine dependence, unspecified, uncomplicated: Secondary | ICD-10-CM | POA: Insufficient documentation

## 2013-06-15 HISTORY — PX: ESOPHAGOGASTRODUODENOSCOPY: SHX5428

## 2013-06-15 HISTORY — PX: COLONOSCOPY: SHX5424

## 2013-06-15 SURGERY — COLONOSCOPY
Anesthesia: Moderate Sedation

## 2013-06-15 MED ORDER — LIDOCAINE VISCOUS 2 % MT SOLN
OROMUCOSAL | Status: AC
  Filled 2013-06-15: qty 15

## 2013-06-15 MED ORDER — MIDAZOLAM HCL 5 MG/5ML IJ SOLN
INTRAMUSCULAR | Status: DC | PRN
  Administered 2013-06-15: 2 mg via INTRAVENOUS
  Administered 2013-06-15: 1 mg via INTRAVENOUS
  Administered 2013-06-15 (×2): 2 mg via INTRAVENOUS
  Administered 2013-06-15 (×2): 1 mg via INTRAVENOUS

## 2013-06-15 MED ORDER — ONDANSETRON HCL 4 MG/2ML IJ SOLN
INTRAMUSCULAR | Status: DC | PRN
  Administered 2013-06-15: 4 mg via INTRAVENOUS

## 2013-06-15 MED ORDER — SODIUM CHLORIDE 0.9 % IV SOLN
INTRAVENOUS | Status: DC
  Administered 2013-06-15: 11:00:00 via INTRAVENOUS

## 2013-06-15 MED ORDER — MIDAZOLAM HCL 5 MG/5ML IJ SOLN
INTRAMUSCULAR | Status: AC
  Filled 2013-06-15: qty 10

## 2013-06-15 MED ORDER — LIDOCAINE VISCOUS 2 % MT SOLN
OROMUCOSAL | Status: DC | PRN
  Administered 2013-06-15: 4 mL via OROMUCOSAL

## 2013-06-15 MED ORDER — MEPERIDINE HCL 100 MG/ML IJ SOLN
INTRAMUSCULAR | Status: AC
  Filled 2013-06-15: qty 2

## 2013-06-15 MED ORDER — MEPERIDINE HCL 100 MG/ML IJ SOLN
INTRAMUSCULAR | Status: DC | PRN
  Administered 2013-06-15 (×2): 25 mg via INTRAVENOUS
  Administered 2013-06-15: 50 mg via INTRAVENOUS
  Administered 2013-06-15: 25 mg via INTRAVENOUS
  Administered 2013-06-15: 50 mg via INTRAVENOUS

## 2013-06-15 MED ORDER — ONDANSETRON HCL 4 MG/2ML IJ SOLN
INTRAMUSCULAR | Status: AC
  Filled 2013-06-15: qty 2

## 2013-06-15 MED ORDER — STERILE WATER FOR IRRIGATION IR SOLN
Status: DC | PRN
  Administered 2013-06-15: 11:00:00

## 2013-06-15 NOTE — Interval H&P Note (Signed)
History and Physical Interval Note:  06/15/2013 11:26 AM  Emmit AlexandersLisa H Presswood  has presented today for surgery, with the diagnosis of RUQ PAIN AND SCREENING COLONOSCOPY  The various methods of treatment have been discussed with the patient and family. After consideration of risks, benefits and other options for treatment, the patient has consented to  Procedure(s) with comments: COLONOSCOPY (N/A) - 11:15-moved to 1200 Leigh Ann to notify pt ESOPHAGOGASTRODUODENOSCOPY (EGD) (N/A) as a surgical intervention .  The patient's history has been reviewed, patient examined, no change in status, stable for surgery.  I have reviewed the patient's chart and labs.  Questions were answered to the patient's satisfaction.    No change. EGD and colonoscopy per plan. The risks, benefits, limitations, imponderables and alternatives regarding both EGD and colonoscopy have been reviewed with the patient. Questions have been answered. All parties agreeable.    Gerrit Friendsobert M Ceejay Kegley

## 2013-06-15 NOTE — Discharge Instructions (Addendum)
EGD Discharge instructions Please read the instructions outlined below and refer to this sheet in the next few weeks. These discharge instructions provide you with general information on caring for yourself after you leave the hospital. Your doctor may also give you specific instructions. While your treatment has been planned according to the most current medical practices available, unavoidable complications occasionally occur. If you have any problems or questions after discharge, please call your doctor. ACTIVITY  You may resume your regular activity but move at a slower pace for the next 24 hours.   Take frequent rest periods for the next 24 hours.   Walking will help expel (get rid of) the air and reduce the bloated feeling in your abdomen.   No driving for 24 hours (because of the anesthesia (medicine) used during the test).   You may shower.   Do not sign any important legal documents or operate any machinery for 24 hours (because of the anesthesia used during the test).  NUTRITION  Drink plenty of fluids.   You may resume your normal diet.   Begin with a light meal and progress to your normal diet.   Avoid alcoholic beverages for 24 hours or as instructed by your caregiver.  MEDICATIONS  You may resume your normal medications unless your caregiver tells you otherwise.  WHAT YOU CAN EXPECT TODAY  You may experience abdominal discomfort such as a feeling of fullness or gas pains.  FOLLOW-UP  Your doctor will discuss the results of your test with you.  SEEK IMMEDIATE MEDICAL ATTENTION IF ANY OF THE FOLLOWING OCCUR:  Excessive nausea (feeling sick to your stomach) and/or vomiting.   Severe abdominal pain and distention (swelling).   Trouble swallowing.   Temperature over 101 F (37.8 C).   Rectal bleeding or vomiting of blood.     Colonoscopy Discharge Instructions  Read the instructions outlined below and refer to this sheet in the next few weeks. These  discharge instructions provide you with general information on caring for yourself after you leave the hospital. Your doctor may also give you specific instructions. While your treatment has been planned according to the most current medical practices available, unavoidable complications occasionally occur. If you have any problems or questions after discharge, call Dr. Gala Romney at [phone]. ACTIVITY  You may resume your regular activity, but move at a slower pace for the next 24 hours.   Take frequent rest periods for the next 24 hours.   Walking will help get rid of the air and reduce the bloated feeling in your belly (abdomen).   No driving for 24 hours (because of the medicine (anesthesia) used during the test).    Do not sign any important legal documents or operate any machinery for 24 hours (because of the anesthesia used during the test).  NUTRITION  Drink plenty of fluids.   You may resume your normal diet as instructed by your doctor.   Begin with a light meal and progress to your normal diet. Heavy or fried foods are harder to digest and may make you feel sick to your stomach (nauseated).   Avoid alcoholic beverages for 24 hours or as instructed.  MEDICATIONS  You may resume your normal medications unless your doctor tells you otherwise.  WHAT YOU CAN EXPECT TODAY  Some feelings of bloating in the abdomen.   Passage of more gas than usual.   Spotting of blood in your stool or on the toilet paper.  IF YOU HAD POLYPS REMOVED DURING  THE COLONOSCOPY:  No aspirin products for 7 days or as instructed.   No alcohol for 7 days or as instructed.   Eat a soft diet for the next 24 hours.  FINDING OUT THE RESULTS OF YOUR TEST Not all test results are available during your visit. If your test results are not back during the visit, make an appointment with your caregiver to find out the results. Do not assume everything is normal if you have not heard from your caregiver or the  medical facility. It is important for you to follow up on all of your test results.  SEEK IMMEDIATE MEDICAL ATTENTION IF:  You have more than a spotting of blood in your stool.   Your belly is swollen (abdominal distention).   You are nauseated or vomiting.   You have a temperature over 101.   You have abdominal pain or discomfort that is severe or gets worse throughout the day.     Hiatal hernia and GERD information provided  Trial of Protonix 40 mg daily x1 month  If no improvement in right upper abdominal pain after this time, we will refer you to a surgeon for consideration of cholecystectomy Gastroesophageal Reflux Disease, Adult Gastroesophageal reflux disease (GERD) happens when acid from your stomach flows up into the esophagus. When acid comes in contact with the esophagus, the acid causes soreness (inflammation) in the esophagus. Over time, GERD may create small holes (ulcers) in the lining of the esophagus. CAUSES   Increased body weight. This puts pressure on the stomach, making acid rise from the stomach into the esophagus.  Smoking. This increases acid production in the stomach.  Drinking alcohol. This causes decreased pressure in the lower esophageal sphincter (valve or ring of muscle between the esophagus and stomach), allowing acid from the stomach into the esophagus.  Late evening meals and a full stomach. This increases pressure and acid production in the stomach.  A malformed lower esophageal sphincter. Sometimes, no cause is found. SYMPTOMS   Burning pain in the lower part of the mid-chest behind the breastbone and in the mid-stomach area. This may occur twice a week or more often.  Trouble swallowing.  Sore throat.  Dry cough.  Asthma-like symptoms including chest tightness, shortness of breath, or wheezing. DIAGNOSIS  Your caregiver may be able to diagnose GERD based on your symptoms. In some cases, X-rays and other tests may be done to check for  complications or to check the condition of your stomach and esophagus. TREATMENT  Your caregiver may recommend over-the-counter or prescription medicines to help decrease acid production. Ask your caregiver before starting or adding any new medicines.  HOME CARE INSTRUCTIONS   Change the factors that you can control. Ask your caregiver for guidance concerning weight loss, quitting smoking, and alcohol consumption.  Avoid foods and drinks that make your symptoms worse, such as:  Caffeine or alcoholic drinks.  Chocolate.  Peppermint or mint flavorings.  Garlic and onions.  Spicy foods.  Citrus fruits, such as oranges, lemons, or limes.  Tomato-based foods such as sauce, chili, salsa, and pizza.  Fried and fatty foods.  Avoid lying down for the 3 hours prior to your bedtime or prior to taking a nap.  Eat small, frequent meals instead of large meals.  Wear loose-fitting clothing. Do not wear anything tight around your waist that causes pressure on your stomach.  Raise the head of your bed 6 to 8 inches with wood blocks to help you sleep. Extra pillows  will not help.  Only take over-the-counter or prescription medicines for pain, discomfort, or fever as directed by your caregiver.  Do not take aspirin, ibuprofen, or other nonsteroidal anti-inflammatory drugs (NSAIDs). SEEK IMMEDIATE MEDICAL CARE IF:   You have pain in your arms, neck, jaw, teeth, or back.  Your pain increases or changes in intensity or duration.  You develop nausea, vomiting, or sweating (diaphoresis).  You develop shortness of breath, or you faint.  Your vomit is green, yellow, black, or looks like coffee grounds or blood.  Your stool is red, bloody, or black. These symptoms could be signs of other problems, such as heart disease, gastric bleeding, or esophageal bleeding. MAKE SURE YOU:   Understand these instructions.  Will watch your condition.  Will get help right away if you are not doing well  or get worse. Document Released: 11/29/2004 Document Revised: 05/14/2011 Document Reviewed: 09/08/2010 [facility] Patient Information 2014 [employer], [place]. Hiatal Hernia A hiatal hernia occurs when a part of the stomach slides above the diaphragm. The diaphragm is the thin muscle separating the belly (abdomen) from the chest. A hiatal hernia can be something you are born with or develop over time. Hiatal hernias may allow stomach acid to flow back into your esophagus, the tube which carries food from your mouth to your stomach. If this acid causes problems it is called GERD (gastro-esophageal reflux disease).  SYMPTOMS  Common symptoms of GERD are heartburn (burning in your chest). This is worse when lying down or bending over. It may also cause belching and indigestion. Some of the things which make GERD worse are:  Increased weight pushes on stomach making acid rise more easily.  Smoking markedly increases acid production.  Alcohol decreases lower esophageal sphincter pressure (valve between stomach and esophagus), allowing acid from stomach into esophagus.  Late evening meals and going to bed with a full stomach increases pressure.  Anything that causes an increase in acid production.  Lower esophageal sphincter incompetence. DIAGNOSIS  Hiatal hernia is often diagnosed with x-rays of your stomach and small bowel. This is called an UGI (upper gastrointestinal x-ray). Sometimes a gastroscopic procedure is done. This is a procedure where your caregiver uses a flexible instrument to look into the stomach and small bowel. HOME CARE INSTRUCTIONS   Try to achieve and maintain an ideal body weight.  Avoid drinking alcoholic beverages.  Stop smoking.  Put the head of your bed on 4 to 6 inch blocks. This will keep your head and esophagus higher than your stomach. If you cannot use blocks, sleep with several pillows under your head and shoulders.  Over-the-counter medications will decrease acid  production. Your caregiver can also prescribe medications for this. Take as directed.  1/2 to 1 teaspoon of an antacid taken every hour while awake, with meals and at bedtime, will neutralize acid.  Do not take aspirin, ibuprofen (Advil or Motrin), or other nonsteroidal anti-inflammatory drugs.  Do not wear tight clothing around your chest or stomach.  Eat smaller meals and eat more frequently. This keeps your stomach from getting too full. Eat slowly.  Do not lie down for 2 or 3 hours after eating. Do not eat or drink anything 1 to 2 hours before going to bed.  Avoid caffeine beverages (colas, coffee, cocoa, tea), fatty foods, citrus fruits and all other foods and drinks that contain acid and that seem to increase the problems.  Avoid bending over, especially after eating. Also avoid straining during bowel movements or when urinating  or lifting things. Anything that increases the pressure in your belly increases the amount of acid that may be pushed up into your esophagus. SEEK IMMEDIATE MEDICAL CARE IF:  There is change in location (pain in arms, neck, jaw, teeth or back) of your pain, or the pain is getting worse.  You also experience nausea, vomiting, sweating (diaphoresis), or shortness of breath.  You develop continual vomiting, vomit blood or coffee ground material, have bright red blood in your stools, or have black tarry stools. Some of these symptoms could signal other problems such as heart disease. MAKE SURE YOU:   Understand these instructions.  Monitor your condition.  Contact your caregiver if you are not doing well or are getting worse. Document Released: 05/12/2003 Document Revised: 05/14/2011 Document Reviewed: 02/19/2005 [facility] Patient Information 2014 [place], [place].

## 2013-06-15 NOTE — Op Note (Signed)
[facility] [address] [place], [id number]   ENDOSCOPY PROCEDURE REPORT  PATIENT: Heather Cruz, Heather H.  MR#: [id number] BIRTHDATE: [birth year] , 52  yrs. old GENDER: Female ENDOSCOPIST: R.  Roetta SessionsMichael Rourk, MD FACP [facility] REFERRED BY:  Simone CuriaStephen Luking, M.D. PROCEDURE DATE:  06/15/2013 PROCEDURE:     Diagnostic EGD  INDICATIONS:     Postprandial right upper quadrant abdominal pain  INFORMED CONSENT:   The risks, benefits, limitations, alternatives and imponderables have been discussed.  The potential for biopsy, esophogeal dilation, etc. have also been reviewed.  Questions have been answered.  All parties agreeable.  Please see the history and physical in the medical record for more information.  MEDICATIONS:     Versed 5 mg IV and Demerol 100 mg divided doses. Xylocaine gel orally. Zofran 4 mg IV  DESCRIPTION OF PROCEDURE:   The [id number] ([id number])  endoscope was introduced through the mouth and advanced to the second portion of the duodenum without difficulty or limitations.  The mucosal surfaces were surveyed very carefully during advancement of the scope and upon withdrawal.  Retroflexion view of the proximal stomach and esophagogastric junction was performed.      FINDINGS:   Noncritical Schatzki's ring; otherwise normal esophagus. Stomach empty. 5 cm hiatal hernia; otherwise, normal appearing gastric mucosa. Patent pylorus. Abnormal-appearing first and second portion of the duodenum.  THERAPEUTIC / DIAGNOSTIC MANEUVERS PERFORMED:  None   COMPLICATIONS:  None  IMPRESSION:   Noncritical Schatzki's ring-not manipulated. Moderately large hiatal hernia otherwise negative EGD  RECOMMENDATIONS:  Trial of proton pump inhibitor therapy. We will see if this makes a difference in her her right upper quadrant symptoms. If it does not, I would then recommend elective surgery consultation for consideration of cholecystectomy as the next diagnostic  maneuver.    _______________________________ R. Roetta SessionsMichael Rourk, MD FACP [facility] eSigned:  R. Roetta SessionsMichael Rourk, MD FACP [facility] 06/15/2013 11:54 AM     CC:  PATIENT NAME:  Heather Cruz, Heather H. MR#: [id number]

## 2013-06-15 NOTE — Op Note (Signed)
[facility] [address] [place], [id number]   COLONOSCOPY PROCEDURE REPORT  PATIENT: [name], Heather H.  MR#:         [id number] BIRTHDATE: [birth year] , 52  yrs. old GENDER: Female ENDOSCOPIST: R.  Roetta SessionsMichael Sargun Rummell, MD FACP [facility] REFERRED BY:  Simone CuriaStephen Luking, M.D. PROCEDURE DATE:  06/15/2013 PROCEDURE:     Screening colonoscopy  INDICATIONS: First-ever average risk colorectal cancer screening  INFORMED CONSENT:  The risks, benefits, alternatives and imponderables including but not limited to bleeding, perforation as well as the possibility of a missed lesion have been reviewed.  The potential for biopsy, lesion removal, etc. have also been discussed.  Questions have been answered.  All parties agreeable. Please see the history and physical in the medical record for more information.  MEDICATIONS: Versed 9 mg IV and Demerol 175 mg IV in divided doses. Zofran 4 mg IV.  DESCRIPTION OF PROCEDURE:  After a digital rectal exam was performed, the EC-3890Li ([id number]) and EC-3490TLi ([id number]) colonoscope was advanced from the anus through the rectum and colon to the area of the cecum, ileocecal valve and appendiceal orifice. The cecum was deeply intubated.  These structures were well-seen and photographed for the record.  From the level of the cecum and ileocecal valve, the scope was slowly and cautiously withdrawn. The mucosal surfaces were carefully surveyed utilizing scope tip deflection to facilitate fold flattening as needed.  The scope was pulled down into the rectum where a thorough examination including retroflexion was performed.    FINDINGS:  Adequate preparation. Normal rectum aside from internal hemorrhoids. Normal appearing colonic mucosa. The left colon was stiff. Difficult to negotiate with the adult scope. I withdrew and obtained the pediatric scope and was able to make it around uneventfully. Please note the patient required quite a bit conscious  sedation to remain comfortable although, technically, this was otherwise a very easy procedure with minimal looping utilizing the pediatric  colonoscope.  THERAPEUTIC / DIAGNOSTIC MANEUVERS PERFORMED:  none  COMPLICATIONS: None  CECAL WITHDRAWAL TIME:  IMPRESSION:  Internal hemorrhoids; otherwise, normal colonoscopy  RECOMMENDATIONS: Repeat colonoscopy for screening purposes in 10 years. At that time, would consider utilizing propofol and a pediatric colonoscope for procedure.   _______________________________ eSigned:  R. Roetta SessionsMichael Floy Angert, MD FACP [facility] 06/15/2013 12:46 PM   CC:

## 2013-06-15 NOTE — H&P (View-Only) (Signed)
    Referring Provider: Luking, William S, MD Primary Care Physician:  LUKING,W S, MD Primary Gastroenterologist:  Dr. Rourk   Chief Complaint  Patient presents with  . Abdominal Pain    x 1 month  . Nausea    at times    HPI:   Heather Cruz presents today at the request of Dr. Luking secondary to abdominal pain. About a month ago experienced onset of nausea, RUQ pain radiating to right posterior shoulder. Noted as intermittent, usually worsened after eating. States she hurt significantly after HIDA scan. US of abdomen with prominent liver with mild fatty change. HIDA with EF 67%. Denies changes in eating habits. States over past few years has been dealing with intermittent nausea and diarrhea. No dysphagia. Takes Zantac BID, with good control of indigestion. Thinks may have IBS. Sometimes may have to run to bathroom after eating but for the past few months has been back to her baseline. Will have spans of time with severe nausea and loose stools for 3-4 days at a time. No rectal bleeding. No melena. No unexplained weight loss.   No prior colonoscopy or EGD. No NSAIDs or aspirin powders.   Past Medical History  Diagnosis Date  . Hypertension   . Tachycardia   . Depression   . Seasonal allergies   . GERD (gastroesophageal reflux disease)   . Back pain   . Migraine   . Perennial allergic rhinitis   . Hyperlipidemia   . PSVT (paroxysmal supraventricular tachycardia)   . Sleep apnea   . Substance abuse     pain medication    Past Surgical History  Procedure Laterality Date  . Tubal ligation    . Abdominal hysterectomy    . Foot surgery    . Cardiac catheterization      and ablation    Current Outpatient Prescriptions  Medication Sig Dispense Refill  . calcium-vitamin D (OSCAL WITH D) 500-200 MG-UNIT per tablet Take 1 tablet by mouth daily.      . diphenhydrAMINE (BENADRYL) 25 mg capsule Take 50 mg by mouth 2 (two) times daily.      . DULoxetine (CYMBALTA) 60 MG  capsule TAKE ONE CAPSULE EVERY DAY  90 capsule  0  . HYDROcodone-acetaminophen (NORCO/VICODIN) 5-325 MG per tablet Take 1 tablet by mouth every 6 (six) hours as needed for pain.  10 tablet  0  . propranolol ER (INDERAL LA) 80 MG 24 hr capsule TAKE ONE CAPSULE EVERY DAY  90 capsule  0  . ranitidine (ZANTAC) 150 MG capsule Take 150 mg by mouth 2 (two) times daily.       . estrogens, conjugated, (PREMARIN) 0.45 MG tablet Take 0.45 mg by mouth daily.      . gemfibrozil (LOPID) 600 MG tablet Take 600 mg by mouth 2 (two) times daily before a meal.       No current facility-administered medications for this visit.    Allergies as of 06/04/2013 - Review Complete 06/04/2013  Allergen Reaction Noted  . Ciprofloxacin Nausea Only 06/27/2012  . Doxycycline Nausea Only 07/09/2012  . Penicillins Hives 06/27/2012  . Sulfa antibiotics Nausea Only 06/27/2012    Family History  Problem Relation Age of Onset  . Hypertension Mother   . Hypertension Father   . Diabetes Father   . Hypertension Brother   . Hypertension Maternal Grandfather   . Heart disease Maternal Grandfather   . Heart disease Paternal Grandmother   . Diabetes Paternal Grandfather   .   Stroke Paternal Grandfather   . Colon cancer Neg Hx     History   Social History  . Marital Status: Divorced    Spouse Name: N/A    Number of Children: N/A  . Years of Education: N/A   Occupational History  . Not on file.   Social History Main Topics  . Smoking status: Current Every Day Smoker -- 1.00 packs/day for 30 years    Types: Cigarettes  . Smokeless tobacco: Not on file  . Alcohol Use: Yes     Comment: occasionally  . Drug Use: No  . Sexual Activity: Not on file   Other Topics Concern  . Not on file   Social History Narrative  . No narrative on file    Review of Systems: As mentioned in HPI.   Physical Exam: BP 143/99  Pulse 88  Temp(Src) 97.2 F (36.2 C) (Oral)  Ht 5\' 1"  (1.549 m)  Wt 131 lb 12.8 oz (59.784 kg)   BMI 24.92 kg/m2 General:   Alert and oriented. Well-developed, well-nourished, pleasant and cooperative. Head:  Normocephalic and atraumatic. Eyes:  Conjunctiva pink, sclera clear, no icterus.   Conjunctiva pink. Ears:  Normal auditory acuity. Nose:  No deformity, discharge,  or lesions. Mouth:  No deformity or lesions, mucosa pink and moist.  Neck:  Supple, without mass or thyromegaly. Lungs:  Clear to auscultation bilaterally, without wheezing, rales, or rhonchi.  Heart:  S1, S2 present without murmurs noted.  Abdomen:  +BS, soft, RUQ discomfort and non-distended. Without mass or HSM. No rebound or guarding. No hernias noted. Rectal:  Deferred  Msk:  Symmetrical without gross deformities. Normal posture. Extremities:  Without clubbing or edema. Neurologic:  Alert and  oriented x4;  grossly normal neurologically. Skin:  Intact, warm and dry without significant lesions or rashes Cervical Nodes:  No significant cervical adenopathy. Psych:  Alert and cooperative. Normal mood and affect.   Lab Results  Component Value Date   WBC 10.9* 05/12/2013   HGB 12.1 05/12/2013   HCT 36.5 05/12/2013   MCV 83.7 05/12/2013   PLT 359 05/12/2013   Lab Results  Component Value Date   LIPASE 19 05/12/2013   Lab Results  Component Value Date   ALT 12 05/12/2013   AST 16 05/12/2013   ALKPHOS 93 05/12/2013   BILITOT 0.3 05/12/2013

## 2013-06-18 ENCOUNTER — Encounter (HOSPITAL_COMMUNITY): Payer: Self-pay | Admitting: Internal Medicine

## 2013-06-29 ENCOUNTER — Ambulatory Visit: Payer: BC Managed Care – PPO | Admitting: Gastroenterology

## 2013-10-09 ENCOUNTER — Other Ambulatory Visit: Payer: Self-pay | Admitting: Family Medicine

## 2013-10-09 ENCOUNTER — Other Ambulatory Visit: Payer: Self-pay | Admitting: *Deleted

## 2013-10-23 ENCOUNTER — Ambulatory Visit (INDEPENDENT_AMBULATORY_CARE_PROVIDER_SITE_OTHER): Payer: No Typology Code available for payment source | Admitting: Nurse Practitioner

## 2013-10-23 ENCOUNTER — Encounter: Payer: Self-pay | Admitting: Nurse Practitioner

## 2013-10-23 VITALS — BP 142/88 | HR 120 | Ht 61.75 in | Wt 133.8 lb

## 2013-10-23 DIAGNOSIS — J302 Other seasonal allergic rhinitis: Secondary | ICD-10-CM

## 2013-10-23 DIAGNOSIS — I471 Supraventricular tachycardia: Secondary | ICD-10-CM

## 2013-10-23 DIAGNOSIS — R000 Tachycardia, unspecified: Secondary | ICD-10-CM

## 2013-10-23 DIAGNOSIS — J3089 Other allergic rhinitis: Secondary | ICD-10-CM

## 2013-10-23 MED ORDER — PROPRANOLOL HCL 40 MG PO TABS: 40.0000 mg | ORAL_TABLET | Freq: Two times a day (BID) | ORAL | Status: DC

## 2013-10-23 MED ORDER — HYDROCODONE-ACETAMINOPHEN 5-325 MG PO TABS: 1.0000 | ORAL_TABLET | Freq: Four times a day (QID) | ORAL | Status: DC | PRN

## 2013-10-23 NOTE — Patient Instructions (Signed)
nasacort AQ as directed 

## 2013-10-29 ENCOUNTER — Encounter: Payer: Self-pay | Admitting: Nurse Practitioner

## 2013-10-29 NOTE — Progress Notes (Signed)
Subjective:  Presents to discuss her tachycardia. Has been off her Inderal for the past 3 months, Inderal LA not covered by her insurance. No chest pain/ischemic type pain or shortness of breath. Also because of change in insurance/loss of job patient is switching to new pain management provider. Is asking for a two-week prescription of her pain medicine to allow her time to find her new provider. Also sees [facility]. Complaints of a flareup of her sinus symptoms/allergies for the past 2 days. Facial area pressure. Off-and-on frontal area headache. No fever. No sore throat or ear pain. No cough. Taking Benadryl twice a day. Does not take decongestants.  Objective:   BP 142/88  Pulse 120  Ht 5' 1.75" (1.568 m)  Wt 133 lb 12.8 oz (60.691 kg)  BMI 24.68 kg/m2 NAD. Alert, oriented. Mildly anxious affect. TMs clear effusion, no erythema. Nasal mucosa pale and boggy. Pharynx injected with clear PND noted. Neck supple with mild soft anterior adenopathy. Lungs clear. Heart regular rate rhythm. Apical pulse 120 and regular. No murmur or gallop noted.  Assessment: PSVT (paroxysmal supraventricular tachycardia)  Other seasonal allergic rhinitis  Tachycardia  Plan:  Meds ordered this encounter  Medications  . pantoprazole (PROTONIX) 40 MG tablet    Sig: Take 40 mg by mouth daily.  . propranolol (INDERAL) 40 MG tablet    Sig: Take 1 tablet (40 mg total) by mouth 2 (two) times daily.    Dispense:  60 tablet    Refill:  5    Order Specific Question:  Supervising Provider    Answer:  Merlyn Albert [[id number]]  . HYDROcodone-acetaminophen (NORCO/VICODIN) 5-325 MG per tablet    Sig: Take 1 tablet by mouth every 6 (six) hours as needed for moderate pain.    Dispense:  60 tablet    Refill:  0    Order Specific Question:  Supervising Provider    Answer:  Merlyn Albert [[id number]]   Add Nasacort AQ to her regimen. Continue antihistamines as directed. Avoid decongestants or other stimulants.  Restart Inderal using twice a day dosing to see if this is less expensive. We'll give a two-week prescription for hydrocodone to allow patient time to find any pain management provider, her current one does not except her new insurance. Call back if symptoms worsen or persist. Return in about 6 months (around 04/25/2014).

## 2013-12-17 ENCOUNTER — Other Ambulatory Visit: Payer: Self-pay | Admitting: Nurse Practitioner

## 2013-12-17 ENCOUNTER — Telehealth: Payer: Self-pay | Admitting: Nurse Practitioner

## 2013-12-17 MED ORDER — HYDROCODONE-ACETAMINOPHEN 5-325 MG PO TABS: 1.0000 | ORAL_TABLET | Freq: Four times a day (QID) | ORAL | Status: DC | PRN

## 2013-12-17 NOTE — Telephone Encounter (Signed)
Will refill and send Rx up front.

## 2013-12-17 NOTE — Telephone Encounter (Signed)
Discussed with pt that script is ready for pickup.

## 2013-12-17 NOTE — Telephone Encounter (Signed)
HYDROcodone-acetaminophen (NORCO/VICODIN) 5-325 MG per tablet  Pt is unable to get to her pain doctor right now due to he not taking her insurance And her insurance runs out at the end of the year as well  Wants to know if we can refill this for her   Last seen/filled 10/23/13

## 2013-12-31 ENCOUNTER — Other Ambulatory Visit: Payer: Self-pay | Admitting: *Deleted

## 2014-01-06 ENCOUNTER — Other Ambulatory Visit: Payer: Self-pay | Admitting: Family Medicine

## 2014-01-09 ENCOUNTER — Other Ambulatory Visit: Payer: Self-pay | Admitting: Family Medicine

## 2014-01-21 ENCOUNTER — Ambulatory Visit (INDEPENDENT_AMBULATORY_CARE_PROVIDER_SITE_OTHER): Payer: No Typology Code available for payment source | Admitting: Family Medicine

## 2014-01-21 ENCOUNTER — Encounter: Payer: Self-pay | Admitting: Family Medicine

## 2014-01-21 VITALS — BP 138/90 | Temp 98.6°F | Ht 61.75 in | Wt 135.0 lb

## 2014-01-21 DIAGNOSIS — R300 Dysuria: Secondary | ICD-10-CM

## 2014-01-21 DIAGNOSIS — J208 Acute bronchitis due to other specified organisms: Secondary | ICD-10-CM

## 2014-01-21 LAB — POCT URINALYSIS DIPSTICK
Blood, UA: POSITIVE
pH, UA: 6

## 2014-01-21 MED ORDER — HYDROCODONE-ACETAMINOPHEN 5-325 MG PO TABS: 1.0000 | ORAL_TABLET | Freq: Three times a day (TID) | ORAL | Status: DC | PRN

## 2014-01-21 MED ORDER — CEFPROZIL 500 MG PO TABS
500.0000 mg | ORAL_TABLET | Freq: Two times a day (BID) | ORAL | Status: DC
Start: 2014-01-21 — End: 2014-05-14

## 2014-01-21 NOTE — Progress Notes (Signed)
   Subjective:    Patient ID: Heather Cruz, female    DOB: 01-10-1961, 53 y.o.   MRN: [id number]  Dysuria  This is a new problem. The current episode started in the past 7 days. The problem occurs every urination. The problem has been gradually worsening. The quality of the pain is described as aching. There has been no fever. Associated symptoms include frequency and urgency. She has tried NSAIDs for the symptoms. The treatment provided mild relief.   Recent coughing congestion over the past several days as well   Review of Systems  Genitourinary: Positive for dysuria, urgency and frequency.   Relates dysuria increase congestion coughing not feeling well    Objective:   Physical Exam  Flanks nontender lower abdomen nontender lungs are clear course cough noted not respiratory distress      Assessment & Plan:  Dysuria and urinary from C antibiotics prescribed should gradually get better  Bronchitis issues antibiotics prescribed should get better  Patient encouraged quit smoking  Patient requesting refill of her pain medicine she states she cannot afford come back and states she is losing her insurance she denies abusing it 60 tablets were given

## 2014-01-24 LAB — URINE CULTURE: Colony Count: >=100000

## 2014-03-01 ENCOUNTER — Telehealth: Payer: Self-pay | Admitting: Family Medicine

## 2014-03-01 MED ORDER — HYDROCODONE-ACETAMINOPHEN 5-325 MG PO TABS: 1.0000 | ORAL_TABLET | Freq: Three times a day (TID) | ORAL | Status: DC | PRN

## 2014-03-01 NOTE — Telephone Encounter (Signed)
Ok times one 

## 2014-03-01 NOTE — Telephone Encounter (Signed)
HYDROcodone-acetaminophen (NORCO/VICODIN) 5-325 MG per tablet   Pt states her insurance runs out as of 1 January, she would like for us to write her  New one so she can have it on hand till she can get new insurance    Last filled & seen,  11/19

## 2014-03-01 NOTE — Telephone Encounter (Signed)
Visit on 01/21/14 was for UTI- last pain management visit 3/15

## 2014-03-02 NOTE — Telephone Encounter (Signed)
Left message on voicemail notifying patient that script is ready for pickup.  

## 2014-03-09 ENCOUNTER — Other Ambulatory Visit: Payer: Self-pay

## 2014-03-09 MED ORDER — PANTOPRAZOLE SODIUM 40 MG PO TBEC: 40.0000 mg | DELAYED_RELEASE_TABLET | Freq: Every day | ORAL | Status: DC

## 2014-05-04 ENCOUNTER — Other Ambulatory Visit: Payer: Self-pay | Admitting: Nurse Practitioner

## 2014-05-14 ENCOUNTER — Ambulatory Visit (INDEPENDENT_AMBULATORY_CARE_PROVIDER_SITE_OTHER): Payer: Self-pay | Admitting: Family Medicine

## 2014-05-14 ENCOUNTER — Encounter: Payer: Self-pay | Admitting: Family Medicine

## 2014-05-14 VITALS — BP 132/84 | Temp 98.7°F | Ht 61.0 in | Wt 138.0 lb

## 2014-05-14 DIAGNOSIS — B029 Zoster without complications: Secondary | ICD-10-CM

## 2014-05-14 MED ORDER — HYDROCODONE-ACETAMINOPHEN 5-325 MG PO TABS: 1.0000 | ORAL_TABLET | Freq: Three times a day (TID) | ORAL | Status: DC | PRN

## 2014-05-14 MED ORDER — VALACYCLOVIR HCL 1 G PO TABS: 1000.0000 mg | ORAL_TABLET | Freq: Three times a day (TID) | ORAL | Status: DC

## 2014-05-14 NOTE — Progress Notes (Signed)
   Subjective:    Patient ID: Heather Cruz, female    DOB: March 26, 1960, 54 y.o.   MRN: [id number]  Rash This is a new problem. The current episode started in the past 7 days. The problem has been gradually worsening since onset. The affected locations include the torso. The rash is characterized by burning, itchiness and redness. Past treatments include nothing.      Review of Systems  Skin: Positive for rash.   Denies fever relates some pain and discomfort    Objective:   Physical Exam  Lungs clear hearts regular patient not toxic right flank has erythematous area consistent with shingles small area.      Assessment & Plan:  Shingles-use Valtrex as directed follow-up if ongoing troubles second option would be acyclovir she cannot afford Valtrex she is to keep this area covered should gradually get better over the next week  She has chronic low back pain discomfort she is not had her pain medicine prescribed since December she was given 1 prescription for 90 tablets she should follow-up within 3 months if needing more.

## 2014-06-07 ENCOUNTER — Other Ambulatory Visit: Payer: Self-pay | Admitting: Nurse Practitioner

## 2014-06-07 ENCOUNTER — Other Ambulatory Visit: Payer: Self-pay | Admitting: Family Medicine

## 2014-06-30 ENCOUNTER — Telehealth: Payer: Self-pay | Admitting: Family Medicine

## 2014-06-30 MED ORDER — PROPRANOLOL HCL 40 MG PO TABS: ORAL_TABLET | ORAL | Status: DC

## 2014-06-30 NOTE — Telephone Encounter (Signed)
Notified patient med has been sent to pharmacy.  

## 2014-06-30 NOTE — Telephone Encounter (Signed)
Pt is needing a refill on her inderal pt states she has an appt scheduled for Fri. Pt states that she ran out yesterday and will not be able to make it till Fri without them.  [facility] [place]

## 2014-06-30 NOTE — Telephone Encounter (Signed)
Ok times one 

## 2014-07-02 ENCOUNTER — Ambulatory Visit (INDEPENDENT_AMBULATORY_CARE_PROVIDER_SITE_OTHER): Payer: Self-pay | Admitting: Family Medicine

## 2014-07-02 ENCOUNTER — Encounter: Payer: Self-pay | Admitting: Family Medicine

## 2014-07-02 VITALS — BP 128/82 | Ht 61.0 in | Wt 138.6 lb

## 2014-07-02 DIAGNOSIS — I10 Essential (primary) hypertension: Secondary | ICD-10-CM

## 2014-07-02 DIAGNOSIS — I471 Supraventricular tachycardia: Secondary | ICD-10-CM

## 2014-07-02 MED ORDER — HYDROCODONE-ACETAMINOPHEN 5-325 MG PO TABS: 1.0000 | ORAL_TABLET | Freq: Three times a day (TID) | ORAL | Status: DC | PRN

## 2014-07-02 MED ORDER — PROPRANOLOL HCL 40 MG PO TABS: ORAL_TABLET | ORAL | Status: DC

## 2014-07-02 MED ORDER — DULOXETINE HCL 60 MG PO CPEP: 60.0000 mg | ORAL_CAPSULE | Freq: Every day | ORAL | Status: DC

## 2014-07-02 MED ORDER — PANTOPRAZOLE SODIUM 40 MG PO TBEC: 40.0000 mg | DELAYED_RELEASE_TABLET | Freq: Every day | ORAL | Status: DC

## 2014-07-02 NOTE — Progress Notes (Signed)
   Subjective:    Patient ID: Heather Cruz, female    DOB: Jan 03, 1961, 54 y.o.   MRN: [id number]  Hypertension This is a chronic problem. The current episode started more than 1 year ago. Associated agents: psvt. Risk factors for coronary artery disease include post-menopausal state. Treatments tried: inderall. There are no compliance problems.    Patient is no longer able to go to pain management because she has no insurance-last rx here 05/2014 for #90 and would like one refill of hydrocodone.uses it primarily for lower back and SI joint, along with cerv neuropaathyu which required pain nmed injectios    Hr ran up when out of the inderal  Review of Systems No headache no chest pain chronic back and neck pain.    Objective:   Physical Exam  Alert mild malaise vitals stable HEENT neck supple lungs clear heart regular in rhythm.      Assessment & Plan:  Impression 1 borderline hypertension/tachycardia #2 cervical pain chronic plan medications refilled. Diet exercise discussed. Hydrocodone refilled. Proper use discussed recheck as scheduled WSL

## 2014-07-27 ENCOUNTER — Telehealth: Payer: Self-pay | Admitting: Family Medicine

## 2014-07-27 NOTE — Telephone Encounter (Signed)
Last visit 07/02/14

## 2014-07-27 NOTE — Telephone Encounter (Signed)
HYDROcodone-acetaminophen (NORCO/VICODIN) 5-325 MG per tablet  Pt needs a refill on this med please

## 2014-07-29 MED ORDER — HYDROCODONE-ACETAMINOPHEN 5-325 MG PO TABS: 1.0000 | ORAL_TABLET | Freq: Three times a day (TID) | ORAL | Status: DC | PRN

## 2014-07-29 NOTE — Telephone Encounter (Signed)
Patient notified script ready for pick up

## 2014-07-29 NOTE — Telephone Encounter (Signed)
May ref times one 

## 2014-09-08 ENCOUNTER — Telehealth: Payer: Self-pay | Admitting: Family Medicine

## 2014-09-08 NOTE — Telephone Encounter (Signed)
Patient last seen 07/02/14- also called for refill hydrocodone 07/27/14 and received rx

## 2014-09-08 NOTE — Telephone Encounter (Signed)
Patient needs Rx for hydrocodone. °

## 2014-09-08 NOTE — Telephone Encounter (Signed)
Ref times one will need o v for any further must last one mo

## 2014-09-09 MED ORDER — HYDROCODONE-ACETAMINOPHEN 5-325 MG PO TABS: 1.0000 | ORAL_TABLET | Freq: Three times a day (TID) | ORAL | Status: DC | PRN

## 2014-09-09 NOTE — Telephone Encounter (Signed)
Rx up front for patient pick up. Patient notified and advised she needs a follow up office visit.

## 2014-10-12 ENCOUNTER — Encounter: Payer: Self-pay | Admitting: Nurse Practitioner

## 2014-10-12 ENCOUNTER — Ambulatory Visit (INDEPENDENT_AMBULATORY_CARE_PROVIDER_SITE_OTHER): Payer: Self-pay | Admitting: Nurse Practitioner

## 2014-10-12 VITALS — BP 158/100 | Ht 61.0 in | Wt 137.4 lb

## 2014-10-12 DIAGNOSIS — Z79891 Long term (current) use of opiate analgesic: Secondary | ICD-10-CM | POA: Insufficient documentation

## 2014-10-12 DIAGNOSIS — I10 Essential (primary) hypertension: Secondary | ICD-10-CM

## 2014-10-12 MED ORDER — HYDROCODONE-ACETAMINOPHEN 5-325 MG PO TABS: 1.0000 | ORAL_TABLET | Freq: Three times a day (TID) | ORAL | Status: DC | PRN

## 2014-10-12 MED ORDER — LISINOPRIL 5 MG PO TABS: 5.0000 mg | ORAL_TABLET | Freq: Every day | ORAL | Status: DC

## 2014-10-12 NOTE — Progress Notes (Signed)
Subjective:  Presents for routine follow-up on her pain medication. Average pain level is 6-7/10. Comes down to a 3-4 after medication use. Worse with certain activities. Patient has quit her job, now taking care of her mother full-time who has had a stroke. Her mother is present today. Has some financial issues due to quitting her job. Palpitations have been stable on Inderal. No chest pain or shortness of breath. No edema.  Objective:   BP 158/100 mmHg  Ht  (1.549 m)  Wt 137 lb 6 oz (62.313 kg)  BMI 25.97 kg/m2 NAD. Alert, oriented. Lungs clear. Heart regular rate rhythm. BP on recheck consistent with nurse reading above 140/90.  Assessment:  Problem List Items Addressed This Visit      Cardiovascular and Mediastinum   Hypertension   Relevant Medications   lisinopril (PRINIVIL,ZESTRIL) 5 MG tablet     Other   Encounter for long-term opiate analgesic use - Primary   Relevant Orders   ToxASSURE Select 13 (MW), Urine     Plan:  Meds ordered this encounter  Medications  . lisinopril (PRINIVIL,ZESTRIL) 5 MG tablet    Sig: Take 1 tablet (5 mg total) by mouth daily.    Dispense:  90 tablet    Refill:  1    Order Specific Question:  Supervising Provider    Answer:  Merlyn Albert [[id number]]  . DISCONTD: HYDROcodone-acetaminophen (NORCO/VICODIN) 5-325 MG per tablet    Sig: Take 1 tablet by mouth 3 (three) times daily as needed for moderate pain.    Dispense:  90 tablet    Refill:  0    Order Specific Question:  Supervising Provider    Answer:  Merlyn Albert [[id number]]  . DISCONTD: HYDROcodone-acetaminophen (NORCO/VICODIN) 5-325 MG per tablet    Sig: Take 1 tablet by mouth 3 (three) times daily as needed for moderate pain.    Dispense:  90 tablet    Refill:  0    May fill 30 days from 10/12/14    Order Specific Question:  Supervising Provider    Answer:  Merlyn Albert [[id number]]  . HYDROcodone-acetaminophen (NORCO/VICODIN) 5-325 MG per tablet    Sig: Take 1 tablet by mouth 3  (three) times daily as needed for moderate pain.    Dispense:  90 tablet    Refill:  0    May fill 60 days from 10/12/14    Order Specific Question:  Supervising Provider    Answer:  Merlyn Albert [[id number]]   Because of her history and chronic opiate use, urine drug screen obtained today. Continue other medications as directed. Add lisinopril 5 mg to regimen for blood pressure. Note patient has had problems with hypertension in the past. Return in about 3 months (around 01/12/2015) for recheck.

## 2014-10-16 LAB — TOXASSURE SELECT 13 (MW), URINE: PDF: 0

## 2014-11-01 NOTE — Progress Notes (Signed)
Call pt, screen revealed another poissible pain med--is she taking any other rx pain meds? Or perhaps eats a lot of poppy seeds??

## 2014-11-14 ENCOUNTER — Encounter: Payer: Self-pay | Admitting: Family Medicine

## 2014-11-17 ENCOUNTER — Telehealth: Payer: Self-pay | Admitting: Family Medicine

## 2014-11-17 NOTE — Telephone Encounter (Signed)
The certified letter that was sent out regarding patients pain medication was signed for by the patient and the receipt has been received.

## 2014-12-31 ENCOUNTER — Ambulatory Visit: Payer: Self-pay | Admitting: Family Medicine

## 2015-01-03 ENCOUNTER — Ambulatory Visit: Payer: Self-pay | Admitting: Family Medicine

## 2015-01-12 ENCOUNTER — Other Ambulatory Visit: Payer: Self-pay | Admitting: Family Medicine

## 2015-01-13 NOTE — Telephone Encounter (Signed)
Three months worth of both

## 2015-01-14 ENCOUNTER — Encounter: Payer: Self-pay | Admitting: Family Medicine

## 2015-01-14 ENCOUNTER — Ambulatory Visit (INDEPENDENT_AMBULATORY_CARE_PROVIDER_SITE_OTHER): Payer: Self-pay | Admitting: Family Medicine

## 2015-01-14 VITALS — BP 184/114 | Ht 61.0 in | Wt 133.0 lb

## 2015-01-14 DIAGNOSIS — I10 Essential (primary) hypertension: Secondary | ICD-10-CM

## 2015-01-14 DIAGNOSIS — J0100 Acute maxillary sinusitis, unspecified: Secondary | ICD-10-CM

## 2015-01-14 MED ORDER — LISINOPRIL 5 MG PO TABS: 5.0000 mg | ORAL_TABLET | Freq: Every day | ORAL | Status: DC

## 2015-01-14 MED ORDER — CEPHALEXIN 500 MG PO CAPS: ORAL_CAPSULE | ORAL | Status: DC

## 2015-01-14 NOTE — Progress Notes (Signed)
   Subjective:    Patient ID: Heather Cruz, female    DOB: 11-23-1960, 54 y.o.   MRN: [id number]  Hypertension This is a chronic problem. The current episode started more than 1 year ago.  Sinusitis This is a new problem. The current episode started in the past 7 days. Associated symptoms include congestion, ear pain, sinus pressure and sneezing.   Patient states no other concerns this visit.      Patient compliant with blood pressure medication. No obvious side effects. Medications reviewed. Watching salt intake.   Reports depression clinically stable. No thoughts of suicide or homicide. Compliant with medications. Meds reviewed.  Frontal headache congestion nasal discharge positive productive cough  Patient reports reflux is stable and medicines well no obvious side effects   Review of Systems  HENT: Positive for congestion, ear pain, sinus pressure and sneezing.        Objective:   Physical Exam  Alert mild malaise. H&T moderate his congestion frontal tenderness trace normal neck supple lungs clear heart regular in rhythm      Assessment & Plan:  Impression 1 hypertension good control #2 rhinosinusitis discussed plan antibiotics prescribed. Symptom care discussed warning signs discussed medications refilled recheck in 6 months WSL

## 2015-04-26 ENCOUNTER — Other Ambulatory Visit: Payer: Self-pay | Admitting: Family Medicine

## 2015-05-27 ENCOUNTER — Other Ambulatory Visit: Payer: Self-pay | Admitting: Family Medicine

## 2015-05-27 ENCOUNTER — Encounter: Payer: Self-pay | Admitting: Nurse Practitioner

## 2015-05-27 ENCOUNTER — Ambulatory Visit (INDEPENDENT_AMBULATORY_CARE_PROVIDER_SITE_OTHER): Payer: Self-pay | Admitting: Nurse Practitioner

## 2015-05-27 VITALS — BP 148/92 | Temp 99.0°F | Ht 61.0 in | Wt 135.1 lb

## 2015-05-27 DIAGNOSIS — K219 Gastro-esophageal reflux disease without esophagitis: Secondary | ICD-10-CM

## 2015-05-27 DIAGNOSIS — J329 Chronic sinusitis, unspecified: Secondary | ICD-10-CM

## 2015-05-27 DIAGNOSIS — I10 Essential (primary) hypertension: Secondary | ICD-10-CM

## 2015-05-27 DIAGNOSIS — J310 Chronic rhinitis: Secondary | ICD-10-CM

## 2015-05-27 MED ORDER — PANTOPRAZOLE SODIUM 40 MG PO TBEC: 40.0000 mg | DELAYED_RELEASE_TABLET | Freq: Every day | ORAL | Status: DC

## 2015-05-27 MED ORDER — CEPHALEXIN 500 MG PO CAPS: ORAL_CAPSULE | ORAL | Status: DC

## 2015-05-27 NOTE — Patient Instructions (Signed)
Nasacort AQ as directed 

## 2015-05-28 ENCOUNTER — Encounter: Payer: Self-pay | Admitting: Nurse Practitioner

## 2015-05-28 DIAGNOSIS — K219 Gastro-esophageal reflux disease without esophagitis: Secondary | ICD-10-CM | POA: Insufficient documentation

## 2015-05-28 NOTE — Progress Notes (Signed)
Subjective:  Presents complaints of sinus symptoms over the past 1-1/2 weeks. Minimal fever. Maxillary area sinus pressure. Runny nose. Slight cough especially at nighttime. No sore throat. Right ear pressure. No wheezing. Also has had a flareup of her acid reflux and heartburn, has been off her Protonix for about 2 months. Was originally prescribed by GI specialist, patient is uninsured and cannot afford to go back at this point. States this works well for her reflux.  Objective:   BP 148/92 mmHg  Temp(Src) 99 F (37.2 C) (Oral)  Ht 5\' 1"  (1.549 m)  Wt 135 lb 2 oz (61.292 kg)  BMI 25.54 kg/m2 NAD. Alert, oriented. TMs clear effusion, no erythema. Pharynx erythematous with PND noted. Neck supple with mild soft anterior adenopathy. Lungs clear. Heart regular rate rhythm. Abdomen soft nondistended with minimal epigastric area tenderness.  Assessment:  Problem List Items Addressed This Visit      Cardiovascular and Mediastinum   Essential hypertension, benign (Chronic)     Digestive   Gastroesophageal reflux disease without esophagitis   Relevant Medications   pantoprazole (PROTONIX) 40 MG tablet    Other Visit Diagnoses    Rhinosinusitis    -  Primary    Relevant Medications    cephALEXin (KEFLEX) 500 MG capsule       Plan:  Meds ordered this encounter  Medications  . cephALEXin (KEFLEX) 500 MG capsule    Sig: Take 1 tablet three times a day for 10 days    Dispense:  30 capsule    Refill:  0    Order Specific Question:  Supervising Provider    Answer:  Merlyn AlbertLUKING, WILLIAM S [[id number]]  . pantoprazole (PROTONIX) 40 MG tablet    Sig: Take 1 tablet (40 mg total) by mouth daily.    Dispense:  30 tablet    Refill:  5    Order Specific Question:  Supervising Provider    Answer:  Merlyn AlbertLUKING, WILLIAM S [[id number]]   OTC meds as directed for congestion and cough. Call back if symptoms worsen or persist.BP is slightly elevated today, recommend patient monitor outside the office and call back if it  remains over 140/90.

## 2015-06-27 ENCOUNTER — Other Ambulatory Visit: Payer: Self-pay | Admitting: Family Medicine

## 2015-07-25 ENCOUNTER — Ambulatory Visit (INDEPENDENT_AMBULATORY_CARE_PROVIDER_SITE_OTHER): Payer: Self-pay | Admitting: Nurse Practitioner

## 2015-07-25 ENCOUNTER — Encounter: Payer: Self-pay | Admitting: Nurse Practitioner

## 2015-07-25 VITALS — BP 132/82 | Temp 98.4°F | Ht 61.0 in | Wt 135.6 lb

## 2015-07-25 DIAGNOSIS — J329 Chronic sinusitis, unspecified: Secondary | ICD-10-CM

## 2015-07-25 MED ORDER — HYDROCODONE-HOMATROPINE 5-1.5 MG/5ML PO SYRP: 5.0000 mL | ORAL_SOLUTION | ORAL | Status: DC | PRN

## 2015-07-25 MED ORDER — CEPHALEXIN 500 MG PO CAPS: ORAL_CAPSULE | ORAL | Status: DC

## 2015-07-25 MED ORDER — METHYLPREDNISOLONE ACETATE 40 MG/ML IJ SUSP
40.0000 mg | Freq: Once | INTRAMUSCULAR | Status: AC
  Administered 2015-07-25: 40 mg via INTRAMUSCULAR

## 2015-07-26 ENCOUNTER — Encounter: Payer: Self-pay | Admitting: Nurse Practitioner

## 2015-07-26 NOTE — Progress Notes (Signed)
Subjective:  Presents for complaints of chest congestion and cough over the past week. Possible fever at nighttime. Sore throat. Laryngitis over the past couple of days. Slight facial area pressure. Yellow-green mucus. Runny nose. Cough worse at nighttime, worse last night. No wheezing. No ear pain. No vomiting diarrhea or abdominal pain.  Objective:   BP 132/82 mmHg  Temp(Src) 98.4 F (36.9 C) (Oral)  Ht 5\' 1"  (1.549 m)  Wt 135 lb 9.6 oz (61.508 kg)  BMI 25.63 kg/m2 NAD. Alert, oriented. TMs clear effusion, no erythema. Pharynx mildly injected with PND noted. Lungs clear. Occasional congested cough noted. Heart regular rate rhythm. No wheezing or tachypnea.  Assessment: Rhinosinusitis - Plan: methylPREDNISolone acetate (DEPO-MEDROL) injection 40 mg  Plan:  Meds ordered this encounter  Medications  . cephALEXin (KEFLEX) 500 MG capsule    Sig: Take 1 tablet three times a day for 10 days    Dispense:  30 capsule    Refill:  0    Order Specific Question:  Supervising Provider    Answer:  Merlyn AlbertLUKING, WILLIAM S [[id number]]  . HYDROcodone-homatropine (HYCODAN) 5-1.5 MG/5ML syrup    Sig: Take 5 mLs by mouth every 4 (four) hours as needed.    Dispense:  120 mL    Refill:  0    Order Specific Question:  Supervising Provider    Answer:  Merlyn AlbertLUKING, WILLIAM S [[id number]]  . methylPREDNISolone acetate (DEPO-MEDROL) injection 40 mg    Sig:    OTC meds as directed for daytime use. Patient given a one-time prescription for hydrocodone cough syrup for nighttime use. Callback in 5-7 days if no improvement, sooner if worse.

## 2015-11-14 ENCOUNTER — Other Ambulatory Visit: Payer: Self-pay | Admitting: Nurse Practitioner

## 2015-12-14 ENCOUNTER — Other Ambulatory Visit: Payer: Self-pay | Admitting: Nurse Practitioner

## 2016-11-14 ENCOUNTER — Other Ambulatory Visit: Payer: Self-pay | Admitting: Nurse Practitioner

## 2016-11-23 ENCOUNTER — Other Ambulatory Visit: Payer: Self-pay | Admitting: Nurse Practitioner

## 2016-12-28 ENCOUNTER — Emergency Department (HOSPITAL_COMMUNITY)
Admission: EM | Admit: 2016-12-28 | Discharge: 2016-12-29 | Disposition: A | Discharge status: Other Acute Inpatient Hospital | Payer: Self-pay | Attending: Emergency Medicine | Admitting: Emergency Medicine

## 2016-12-28 ENCOUNTER — Emergency Department (HOSPITAL_COMMUNITY): Payer: Self-pay

## 2016-12-28 ENCOUNTER — Encounter (HOSPITAL_COMMUNITY): Payer: Self-pay | Admitting: Emergency Medicine

## 2016-12-28 DIAGNOSIS — Z046 Encounter for general psychiatric examination, requested by authority: Secondary | ICD-10-CM | POA: Insufficient documentation

## 2016-12-28 DIAGNOSIS — F329 Major depressive disorder, single episode, unspecified: Secondary | ICD-10-CM | POA: Insufficient documentation

## 2016-12-28 DIAGNOSIS — F32A Depression, unspecified: Secondary | ICD-10-CM

## 2016-12-28 DIAGNOSIS — Y939 Activity, unspecified: Secondary | ICD-10-CM | POA: Insufficient documentation

## 2016-12-28 DIAGNOSIS — Y929 Unspecified place or not applicable: Secondary | ICD-10-CM | POA: Insufficient documentation

## 2016-12-28 DIAGNOSIS — F17210 Nicotine dependence, cigarettes, uncomplicated: Secondary | ICD-10-CM | POA: Insufficient documentation

## 2016-12-28 DIAGNOSIS — Z79899 Other long term (current) drug therapy: Secondary | ICD-10-CM | POA: Insufficient documentation

## 2016-12-28 DIAGNOSIS — Y999 Unspecified external cause status: Secondary | ICD-10-CM | POA: Insufficient documentation

## 2016-12-28 DIAGNOSIS — I10 Essential (primary) hypertension: Secondary | ICD-10-CM | POA: Insufficient documentation

## 2016-12-28 DIAGNOSIS — R45851 Suicidal ideations: Secondary | ICD-10-CM | POA: Insufficient documentation

## 2016-12-28 DIAGNOSIS — X838XXA Intentional self-harm by other specified means, initial encounter: Secondary | ICD-10-CM | POA: Insufficient documentation

## 2016-12-28 DIAGNOSIS — T50992A Poisoning by other drugs, medicaments and biological substances, intentional self-harm, initial encounter: Secondary | ICD-10-CM | POA: Insufficient documentation

## 2016-12-28 LAB — CBC WITH DIFFERENTIAL/PLATELET
BASOS ABS: 0 10*3/uL (ref 0.0–0.1)
BASOS PCT: 0 %
EOS ABS: 0.2 10*3/uL (ref 0.0–0.7)
Eosinophils Relative: 1 %
HCT: 38 % (ref 36.0–46.0)
HEMOGLOBIN: 12.1 g/dL (ref 12.0–15.0)
Lymphocytes Relative: 35 %
Lymphs Abs: 4.2 10*3/uL — ABNORMAL HIGH (ref 0.7–4.0)
MCH: 25.2 pg — ABNORMAL LOW (ref 26.0–34.0)
MCHC: 31.8 g/dL (ref 30.0–36.0)
MCV: 79.2 fL (ref 78.0–100.0)
Monocytes Absolute: 0.5 10*3/uL (ref 0.1–1.0)
Monocytes Relative: 4 %
NEUTROS PCT: 60 %
Neutro Abs: 7.1 10*3/uL (ref 1.7–7.7)
Platelets: 477 10*3/uL — ABNORMAL HIGH (ref 150–400)
RBC: 4.8 MIL/uL (ref 3.87–5.11)
RDW: 18.1 % — ABNORMAL HIGH (ref 11.5–15.5)
WBC: 12 10*3/uL — AB (ref 4.0–10.5)

## 2016-12-28 LAB — COMPREHENSIVE METABOLIC PANEL
ALBUMIN: 4.1 g/dL (ref 3.5–5.0)
ALK PHOS: 115 U/L (ref 38–126)
ALT: 13 U/L — AB (ref 14–54)
ANION GAP: 10 (ref 5–15)
AST: 27 U/L (ref 15–41)
BUN: 7 mg/dL (ref 6–20)
CALCIUM: 9.3 mg/dL (ref 8.9–10.3)
CO2: 23 mmol/L (ref 22–32)
Chloride: 105 mmol/L (ref 101–111)
Creatinine, Ser: 0.91 mg/dL (ref 0.44–1.00)
GFR calc Af Amer: >60 mL/min (ref >60)
GFR calc non Af Amer: >60 mL/min (ref >60)
GLUCOSE: 205 mg/dL — AB (ref 65–99)
Potassium: 4.8 mmol/L (ref 3.5–5.1)
SODIUM: 138 mmol/L (ref 135–145)
Total Bilirubin: 0.4 mg/dL (ref 0.3–1.2)
Total Protein: 7.3 g/dL (ref 6.5–8.1)

## 2016-12-28 LAB — ACETAMINOPHEN LEVEL: ACETAMINOPHEN (TYLENOL), SERUM: 10 ug/mL (ref 10–30)

## 2016-12-28 LAB — I-STAT TROPONIN, ED: Troponin i, poc: 0 ng/mL (ref 0.00–0.08)

## 2016-12-28 LAB — SALICYLATE LEVEL

## 2016-12-28 NOTE — ED Provider Notes (Signed)
[facility] EMERGENCY DEPARTMENT Provider Note   CSN: [id number] Arrival date & time: 12/28/16  2139     History   Chief Complaint Chief Complaint  Patient presents with  . Drug Overdose    HPI Heather Cruz is a 56 y.o. female.  Patient is a 56 year old female with a history of depression, polysubstance abuse, paroxysmal supraventricular tachycardia, migraines, hypertension, hyperlipidemia who is presenting today by EMS for overdose.  Patient states that Heather Cruz has been more depressed and is just tired of living so Heather Cruz decided to overdose on her propranolol.  Heather Cruz states Heather Cruz has been taking pills throughout the day but then around 9:00 took a handful of propranolol.  Heather Cruz also has meloxicam and Xanax which Heather Cruz took a dose at noon but denies taking excess pills of her Xanax.  Heather Cruz denies any alcohol or drug use.  Heather Cruz does admit to trying to hurt herself in the past.  Heather Cruz currently complains of feeling drowsy and feeling nauseated.  Heather Cruz did have multiple episodes of vomiting after taking large amounts of pills.  Heather Cruz is unsure of pill fragments were in the vomitus.  Heather Cruz denies any chest pain, shortness of breath or abdominal pain.   The history is provided by the patient and the EMS personnel.  Drug Overdose  This is a new problem.    Past Medical History:  Diagnosis Date  . Back pain   . Depression   . GERD (gastroesophageal reflux disease)   . Hyperlipidemia   . Hypertension   . Migraine   . Perennial allergic rhinitis   . PSVT (paroxysmal supraventricular tachycardia) (HCC)   . Seasonal allergies   . Sleep apnea   . Substance abuse (HCC)    pain medication  . Tachycardia     Patient Active Problem List   Diagnosis Date Noted  . Gastroesophageal reflux disease without esophagitis 05/28/2015  . Hypertension 10/12/2014  . Encounter for long-term opiate analgesic use 10/12/2014  . Abdominal pain, right upper quadrant 06/04/2013  . Encounter for screening  colonoscopy 06/04/2013  . Migraines 06/27/2012  . Allergic rhinitis 06/27/2012  . Essential hypertension, benign 06/27/2012  . Hyperlipidemia 06/27/2012  . 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 PSVT (paroxysmal supraventricular tachycardia) (HCC) 06/27/2012    Past Surgical History:  Procedure Laterality Date  . ABDOMINAL HYSTERECTOMY    . CARDIAC CATHETERIZATION     and ablation  . COLONOSCOPY N/A 06/15/2013   Procedure: COLONOSCOPY;  Surgeon: Corbin Ade, MD;  Location: [facility] SUITE;  Service: Endoscopy;  Laterality: N/A;  11:15-moved to 1200 Leigh Ann to notify pt  . ESOPHAGOGASTRODUODENOSCOPY N/A 06/15/2013   Procedure: ESOPHAGOGASTRODUODENOSCOPY (EGD);  Surgeon: Corbin Ade, MD;  Location: [facility] END[facility] SUITE;  Service: Endoscopy;  Laterality: N/A;  . FOOT SURGERY    . TUBAL LIGATION      OB History    No data available       Home Medications    Prior to Admission medications   Medication Sig Start Date End Date Taking? Authorizing Provider  acetaminophen (TYLENOL) 325 MG tablet Take 1,000 mg by mouth 2 (two) times daily.   Yes [provider]  ALPRAZolam Prudy Feeler) 1 MG tablet Take 1 mg by mouth 3 (three) times daily as needed for anxiety.   Yes [provider]  diphenhydrAMINE (BENADRYL) 25 MG tablet Take 50 mg by mouth 2 (two) times daily.   Yes [provider]  DULoxetine (CYMBALTA) 60 MG capsule  TAKE ONE CAPSULE BY MOUTH ONCE DAILY 12/15/15  Yes Campbell Riches, NP  HYDROcodone-acetaminophen (NORCO/VICODIN) 5-325 MG tablet Take 1 tablet by mouth every 6 (six) hours as needed for moderate pain.   Yes [provider]  lisinopril (PRINIVIL,ZESTRIL) 5 MG tablet Take 1 tablet (5 mg total) by mouth daily. 01/14/15  Yes Merlyn Albert, MD  pantoprazole (PROTONIX) 40 MG tablet TAKE ONE TABLET BY MOUTH ONCE DAILY 11/23/16  Yes Merlyn Albert, MD  propranolol (INDERAL) 40 MG tablet TAKE ONE TABLET BY MOUTH TWICE DAILY 12/15/15   Yes Campbell Riches, NP    Family History Family History  Problem Relation Age of Onset  . Hypertension Mother   . Hypertension Father   . Diabetes Father   . Hypertension Brother   . Hypertension Maternal Grandfather   . Heart disease Maternal Grandfather   . Heart disease Paternal Grandmother   . Diabetes Paternal Grandfather   . Stroke Paternal Grandfather   . Colon cancer Neg Hx     Social History Social History  Substance Use Topics  . Smoking status: Current Every Day Smoker    Packs/day: 1.00    Years: 30.00    Types: Cigarettes  . Smokeless tobacco: Not on file  . Alcohol use Yes     Comment: occasionally     Allergies   Ciprofloxacin; Doxycycline; Penicillins; and Sulfa antibiotics   Review of Systems Review of Systems  All other systems reviewed and are negative.    Physical Exam Updated Vital Signs BP (!) 192/118   Pulse 67   Resp 16   SpO2 94%   Physical Exam  Constitutional: Heather Cruz is oriented to person, place, and time. Heather Cruz appears well-developed and well-nourished. No distress.  HENT:  Head: Normocephalic and atraumatic.  Dry mucous membranes  Eyes: Conjunctivae and EOM are normal.  Pupils are 5 mm and reactive bilaterally  Neck: Normal range of motion. Neck supple.  Cardiovascular: Normal rate, regular rhythm and intact distal pulses.   No murmur heard. Pulmonary/Chest: Effort normal and breath sounds normal. No respiratory distress. Heather Cruz has no wheezes. Heather Cruz has no rales.  Abdominal: Soft. Heather Cruz exhibits no distension. There is no tenderness. There is no rebound and no guarding.  Musculoskeletal: Normal range of motion. Heather Cruz exhibits no edema or tenderness.  Neurological: Heather Cruz is alert and oriented to person, place, and time.  Slightly drowsy but awake throughout the exam and able to answer questions appropriately.  5 out of 5 strength in bilateral upper and lower extremities.  Sensation is intact.  Skin: Skin is warm and dry. Capillary refill  takes 2 to 3 seconds. No rash noted. No erythema.  Psychiatric: Heather Cruz exhibits a depressed mood. Heather Cruz expresses suicidal ideation. Heather Cruz expresses suicidal plans.  Nursing note and vitals reviewed.    ED Treatments / Results  Labs (all labs ordered are listed, but only abnormal results are displayed) Labs Reviewed  CBC WITH DIFFERENTIAL/PLATELET - Abnormal; Notable for the following:       Result Value   WBC 12.0 (*)    MCH 25.2 (*)    RDW 18.1 (*)    Platelets 477 (*)    Lymphs Abs 4.2 (*)    All other components within normal limits  COMPREHENSIVE METABOLIC PANEL - Abnormal; Notable for the following:    Glucose, Bld 205 (*)    ALT 13 (*)    All other components within normal limits  RAPID URINE DRUG SCREEN, HOSP PERFORMED - Abnormal;  Notable for the following:    Opiates POSITIVE (*)    Benzodiazepines POSITIVE (*)    All other components within normal limits  ACETAMINOPHEN LEVEL  SALICYLATE LEVEL  I-STAT TROPONIN, ED    EKG  EKG Interpretation  Date/Time:  Friday December 28 2016 21:51:37 EDT Ventricular Rate:  68 PR Interval:    QRS Duration: 106 QT Interval:  454 QTC Calculation: 483 R Axis:   22 Text Interpretation:  Sinus rhythm Abnormal T, consider ischemia, lateral leads Baseline wander in lead(s) V2 No significant change since last tracing Confirmed by Gwyneth Sproutlunkett, Hiliana Eilts ([id number]) on 12/28/2016 9:59:23 PM       Radiology Dg Chest Port 1 View  Result Date: 12/28/2016 CLINICAL DATA:  Overdose EXAM: PORTABLE CHEST 1 VIEW COMPARISON:  05/02/2016 FINDINGS: Moderate hiatal hernia. No acute consolidation or effusion. Normal heart size. No pneumothorax. IMPRESSION: No active disease.  Moderate hiatal hernia Electronically Signed   By: Jasmine PangKim  Fujinaga M.D.   On: 12/28/2016 22:10    Procedures Procedures (including critical care time)  Medications Ordered in ED Medications - No data to display   Initial Impression / Assessment and Plan / ED Course  I have reviewed  the triage vital signs and the nursing notes.  Pertinent labs & imaging results that were available during my care of the patient were reviewed by me and considered in my medical decision making (see chart for details).     Patient presenting today as a an intentional overdose with propranolol.  Patient states Heather Cruz took a handful of tablets around 9:00 today.  EMS reports that based on her bottle and when it was filled there are 46 tablets missing.  Patient did have some vomitus after overdosing on the pills.  Heather Cruz currently has a normal heart rate and is hypertensive.  There are no signs of propranolol overdose at this time.  Tylenol, salicylate, EtOH are all within normal limits.  Labs are otherwise stable.  Discussed with poison control who recommended monitoring for hypotension and bradycardia.  Recommend doing bicarb if the symptoms start.  At this time will need to call poison control back before estimated time of being watched.  Patient will require psych evaluation after medically clear.  We will give hydralazine for hypertension. 12:41 AM Given hydralazine for hypertension.  Will be medically clear after 6 hours so around 4 AM in the morning.  Patient will then need TTS consult   Final Clinical Impressions(s) / ED Diagnoses   Final diagnoses:  Suicidal ideation  Depression, unspecified depression type  Hypertension, unspecified type    New Prescriptions New Prescriptions   No medications on file     Gwyneth SproutPlunkett, Mariacristina Aday, MD 12/29/16 [phone]

## 2016-12-28 NOTE — ED Notes (Signed)
Per Judeth CornfieldStephanie from poison control, monitor pt for hypotension, bradycardia, av block, seizure activity, widening QRS and respiratory depression. If QRS >120-140, give sodium bicarb 1-602mEq/kg IV push, repeat EKG.

## 2016-12-28 NOTE — ED Triage Notes (Addendum)
Patient from home, states that she does not want to live anymore.  She took 48 propranolol 40mg  pills and 5 meloxicam and one Xanax this evening.  She is pale, nauseated and has vomited some pill contents.  Patient is alert, pulses are weak peripherally, she is hypertensive at this time.

## 2016-12-29 ENCOUNTER — Emergency Department (HOSPITAL_COMMUNITY): Payer: Self-pay

## 2016-12-29 LAB — RAPID URINE DRUG SCREEN, HOSP PERFORMED
Amphetamines: NOT DETECTED
BARBITURATES: NOT DETECTED
Benzodiazepines: POSITIVE — AB
Cocaine: NOT DETECTED
Opiates: POSITIVE — AB
Tetrahydrocannabinol: NOT DETECTED

## 2016-12-29 MED ORDER — DULOXETINE HCL 60 MG PO CPEP
60.0000 mg | ORAL_CAPSULE | Freq: Every day | ORAL | Status: DC
  Administered 2016-12-29: 60 mg via ORAL
  Filled 2016-12-29: qty 1

## 2016-12-29 MED ORDER — HYDRALAZINE HCL 20 MG/ML IJ SOLN
10.0000 mg | Freq: Once | INTRAMUSCULAR | Status: AC
  Administered 2016-12-29: 10 mg via INTRAVENOUS
  Filled 2016-12-29: qty 1

## 2016-12-29 MED ORDER — PANTOPRAZOLE SODIUM 40 MG PO TBEC
40.0000 mg | DELAYED_RELEASE_TABLET | Freq: Every day | ORAL | Status: DC
  Administered 2016-12-29: 40 mg via ORAL
  Filled 2016-12-29: qty 1

## 2016-12-29 MED ORDER — PROPRANOLOL HCL 40 MG PO TABS
40.0000 mg | ORAL_TABLET | Freq: Two times a day (BID) | ORAL | Status: DC
Start: 2016-12-29 — End: 2016-12-29
  Administered 2016-12-29: 40 mg via ORAL
  Filled 2016-12-29: qty 1

## 2016-12-29 MED ORDER — ACETAMINOPHEN 325 MG PO TABS
650.0000 mg | ORAL_TABLET | ORAL | Status: DC | PRN
  Administered 2016-12-29: 650 mg via ORAL
  Filled 2016-12-29: qty 2

## 2016-12-29 MED ORDER — SODIUM CHLORIDE 0.9 % IV BOLUS (SEPSIS)
1000.0000 mL | Freq: Once | INTRAVENOUS | Status: DC
Start: 2016-12-29 — End: 2016-12-29

## 2016-12-29 MED ORDER — LISINOPRIL 2.5 MG PO TABS
5.0000 mg | ORAL_TABLET | Freq: Every day | ORAL | Status: DC
Start: 2016-12-29 — End: 2016-12-29
  Administered 2016-12-29: 5 mg via ORAL
  Filled 2016-12-29: qty 2

## 2016-12-29 MED ORDER — ALPRAZOLAM 0.5 MG PO TABS: 1.0000 mg | ORAL_TABLET | Freq: Three times a day (TID) | ORAL | Status: DC | PRN

## 2016-12-29 MED ORDER — DIPHENHYDRAMINE HCL 25 MG PO TABS
50.0000 mg | ORAL_TABLET | Freq: Two times a day (BID) | ORAL | Status: DC
Start: 2016-12-29 — End: 2016-12-29
  Administered 2016-12-29: 50 mg via ORAL
  Filled 2016-12-29: qty 2

## 2016-12-29 NOTE — ED Notes (Signed)
Regular Diet has been ordered for Lunch. 

## 2016-12-29 NOTE — ED Notes (Signed)
Son would like an update on the PT if she will be admitted: Mardene CelesteWayne Burcham, [phone]

## 2016-12-29 NOTE — ED Notes (Signed)
Per Dr Nicanor AlconPalumbo, NS 1000ml IV bolus and I-stat HCG d/c'd.

## 2016-12-29 NOTE — ED Provider Notes (Signed)
Patient has been accepted at [facility] by Dr. Emily FilbertVenkata Chivukula.    [name], [name], MD 12/29/16 1345

## 2016-12-29 NOTE — ED Notes (Signed)
Pancakes and Grits w/ Sprite ordered for Breakfast.

## 2016-12-29 NOTE — Progress Notes (Signed)
Pt being referred for inpatient psychiatric treatment. Assessment notes pt under IVC however there is no IVC paperwork- pt agreeing to voluntary admission per ED.  Unable to be considered for admission to [facility] today due [facility].  [facility] [place]  Ilean SkillMeghan Kamrin Spath, MSW, LCSW Clinical Social Work 12/29/2016 Coverage for [phone]

## 2016-12-29 NOTE — ED Notes (Signed)
Pt aware and voices agreement w/tx plan - [facility] Pt called her son, Deniece PortelaWayne, to advise.

## 2016-12-29 NOTE — BH Assessment (Addendum)
Tele Assessment Note   Patient Name: Heather Cruz MRN: [id number] Referring Physician: Dr. Elpidio Anis Location of Patient: [facility] Location of Provider: Behavioral Health TTS Department  Heather Cruz is an 56 y.o. female presented under IVC for attempted overdose. Patient reported taking 48 propranolol 40mg  pills and 5 meloxicam and one Xanax this evening. Patient reported increased depression and is just tired of living so she decided to overdose on her propranolol.  Patient reported dealing with depression for past 30 years. Patient admitted to suicidal ideations with plan started approx 2 months ago. Patient reported that she had been taking pills throughout the day but then around 9:00 took a handful of propranolol. Patient reports having meloxicam and Xanax which she took a dose at noon but denies taking excess pills of her Xanax.  She denies any alcohol or drug use. Patient admited to trying to hurt herself in the past. Patient reported being worried about family members a lot and having to care for her 60 year granddaughter. 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 Patient reports no HI and no psychosis. Patient reported smoking cigarettes, 1 pack a day.   Patient was tearful and sad throughout entire assessment. Patient reported that today was the day for her to die and decided to overdose. Patient was unable to share triggers, only that she is tired of taking care of everyone else.      Diagnosis: Major Depressive Disorder  Past Medical History:  Past Medical History:  Diagnosis Date  . Back pain   . Depression   . GERD (gastroesophageal reflux disease)   . Hyperlipidemia   . Hypertension   . Migraine   . Perennial allergic rhinitis   . PSVT  (paroxysmal supraventricular tachycardia) (HCC)   . Seasonal allergies   . Sleep apnea   . Substance abuse (HCC)    pain medication  . Tachycardia     Past Surgical History:  Procedure Laterality Date  . ABDOMINAL HYSTERECTOMY    . CARDIAC CATHETERIZATION     and ablation  . COLONOSCOPY N/A 06/15/2013   Procedure: COLONOSCOPY;  Surgeon: Corbin Ade, MD;  Location: [facility];  Service: Endoscopy;  Laterality: N/A;  11:15-moved to 1200 Leigh Ann to notify pt  . ESOPHAGOGASTRODUODENOSCOPY N/A 06/15/2013   Procedure: ESOPHAGOGASTRODUODENOSCOPY (EGD);  Surgeon: Corbin Ade, MD;  Location: [facility];  Service: Endoscopy;  Laterality: N/A;  . FOOT SURGERY    . TUBAL LIGATION      Family History:  Family History  Problem Relation Age of Onset  . Hypertension Mother   . Hypertension Father   . Diabetes Father   . Hypertension Brother   . Hypertension Maternal Grandfather   . Heart disease Maternal Grandfather   . Heart disease Paternal Grandmother   . Diabetes Paternal Grandfather   . Stroke Paternal Grandfather   . Colon cancer Neg Hx     Social History:  reports that she has been smoking Cigarettes.  She has a 30.00 pack-year smoking history. She does not have any smokeless tobacco history on file. She reports that she drinks alcohol. She reports that she does not use drugs.  Additional Social History:     CIWA: CIWA-Ar BP: (!) 137/105 Pulse Rate: 85 COWS:  PATIENT STRENGTHS: (choose at least two) Average or above average intelligence Capable of independent living Supportive family/friends  Allergies:  Allergies  Allergen Reactions  . Ciprofloxacin Nausea Only  . Doxycycline Nausea Only  . Penicillins Hives    Can take and tolerate cephalosporins  . Sulfa Antibiotics Nausea Only    Home Medications:  (Not in a hospital admission)  OB/GYN Status:  No LMP recorded. Patient has had a hysterectomy.  General Assessment Data TTS Assessment: In  system Is this a Tele or Face-to-Face Assessment?: Tele Assessment Is this an Initial Assessment or a Re-assessment for this encounter?: Initial Assessment Marital status: Single Maiden name:  (unknown) Is patient pregnant?: No Pregnancy Status: No Living Arrangements: Parent Can pt return to current living arrangement?: Yes Admission Status: Involuntary Is patient capable of signing voluntary admission?: Yes Referral Source: MD Insurance type: unknown  Medical Screening Exam [facility] Walk-in ONLY) Medical Exam completed: Yes  Crisis Care Plan Living Arrangements: Parent Legal Guardian: Mother Name of Psychiatrist: none Name of Therapist: none  Education Status Is patient currently in school?: No Current Grade: unknown Highest grade of school patient has completed: High School diploma Name of school: unknown Contact person: no  Risk to self with the past 6 months Suicidal Ideation: Yes-Currently Present Has patient been a risk to self within the past 6 months prior to admission? : No Suicidal Intent: Yes-Currently Present Has patient had any suicidal intent within the past 6 months prior to admission? : Yes Is patient at risk for suicide?: Yes Suicidal Plan?:  (very good!) Has patient had any suicidal plan within the past 6 months prior to admission? : Yes Access to Means:  (patient attmpeted od with pills) What has been your use of drugs/alcohol within the last 12 months?:  (none) How many times?: 0 Other Self Harm Risks: 0 Triggers for Past Attempts: Family contact (family contact) Intentional Self Injurious Behavior: None Family Suicide History: No Recent stressful life event(s): Job Loss, Financial Problems, Recent negative physical changes, Legal Issues Persecutory voices/beliefs?: No Depression: No Depression Symptoms: Loss of interest in usual pleasures, Tearfulness Substance abuse history and/or treatment for substance abuse?: No Suicide prevention information  given to non-admitted patients: Not applicable  Risk to Others within the past 6 months Homicidal Ideation: No-Not Currently/Within Last 6 Months Does patient have any lifetime risk of violence toward others beyond the six months prior to admission? : No Thoughts of Harm to Others: No-Not Currently Present/Within Last 6 Months Current Homicidal Intent: No-Not Currently/Within Last 6 Months Current Homicidal Plan: No-Not Currently/Within Last 6 Months Access to Homicidal Means: No Identified Victim: yes History of harm to others?: Yes Assessment of Violence: On admission Does patient have access to weapons?: No Criminal Charges Pending?: No Does patient have a court date: No Is patient on probation?: No  Psychosis Hallucinations: None noted Delusions: None noted  Mental Status Report Appearance/Hygiene: Unremarkable Eye Contact: Good Motor Activity: Unremarkable Speech: Logical/coherent Level of Consciousness: Alert, Crying Mood: Anxious, Sad, Depressed Affect: Depressed, Fearful, Sad Anxiety Level: Moderate Thought Processes: Coherent Judgement: Impaired Orientation: Person, Place, Time, Situation, Appropriate for developmental age Obsessive Compulsive Thoughts/Behaviors: None  Cognitive Functioning Memory: Recent Intact, Remote Intact IQ: Average Insight: Fair Impulse Control: Poor Appetite: Fair Weight Loss:  (none) Weight Gain:  (none) Sleep: No Change Total Hours of Sleep:  (8.5 hours) Vegetative Symptoms: None  ADLScreening [facility] Assessment Services) Patient's cognitive ability adequate to safely complete daily activities?: Yes Patient able to express need for assistance  with ADLs?: Yes Independently performs ADLs?: Yes (appropriate for developmental age)  Prior Inpatient Therapy Prior Inpatient Therapy: No Prior Therapy Dates: none Prior Therapy Facilty/Provider(s): none Reason for Treatment: none  Prior Outpatient Therapy Prior Outpatient Therapy:  No Reason for Treatment:  (none) Does patient have an ACCT team?: No Does patient have Intensive In-House Services?  : No Does patient have [facility] services? : No Does patient have P4CC services?: No  ADL Screening (condition at time of admission) Patient's cognitive ability adequate to safely complete daily activities?: Yes Patient able to express need for assistance with ADLs?: Yes Independently performs ADLs?: Yes (appropriate for developmental age)                  Additional Information 1:1 In Past 12 Months?: No CIRT Risk: No Elopement Risk: No Does patient have medical clearance?: Yes  Child/Adolescent Assessment Running Away Risk: Denies Bed-Wetting: Denies Destruction of Property: Denies Cruelty to Animals: Denies Stealing: Denies Rebellious/Defies Authority: Denies Satanic Involvement: Denies Archivist: Denies Problems at [employer]: Denies Gang Involvement: Denies  Disposition:  Counselor reviewed patient with Donell Sievert, recommended inpatient treatment. Counselor communicated recommendation with Dr. Elpidio Anis. Per Rutha Bouchard, patient daughter is on the unit, therefore patient needs inpatient placement elsewhere. Disposition Initial Assessment Completed for this Encounter: Yes Disposition of Patient: Inpatient treatment program  This service was provided via telemedicine using a 2-way, interactive audio and video technology.  Names of all persons participating in this telemedicine service and their role in this encounter. Name: Al Corpus, [facility] TTS  [facility] Patient          Burnetta Sabin 12/29/2016 5:58 AM

## 2016-12-29 NOTE — ED Notes (Signed)
Pt's son, Deniece PortelaWayne, visiting w/pt. ALL of pt's belongings given so son as pt requested. Pt and son aware of visitation policy and that he may bring her eyeglasses and a book. Aware of tx plan - voiced agreement and understanding. Pt states she wants inpt d/t stressful living w/her parents and taking care of her blind, demented mother.

## 2016-12-29 NOTE — ED Notes (Signed)
Belongings placed in locker #2. Pt home medications, and personal belongings to be sent home with son per pt when he comes to visit her.

## 2016-12-29 NOTE — ED Notes (Signed)
Patient transported to CT 

## 2016-12-29 NOTE — ED Notes (Signed)
Pt's sister visiting

## 2016-12-29 NOTE — ED Notes (Signed)
Pt wanded by security. 

## 2016-12-29 NOTE — Progress Notes (Signed)
[facility] by Dr Emily FilbertVenkata Chivukula.  Report [phone].  Per Thayer Ohmhris in intake, pt can arrive anytime prior to 11pm, otherwise bed will be held until after 6am tomorrow.   Ilean SkillMeghan Polina Burmaster, MSW, LCSW Clinical Social Work 12/29/2016 Coverage for [phone]

## 2016-12-29 NOTE — ED Notes (Signed)
Per report, pt is voluntary.

## 2016-12-29 NOTE — ED Notes (Signed)
Spoke with Misty [facility]Lisa from [employer] [facility]. And discussed pt plan. No new recommendations and Misty StanleyLisa states she is closing her case at this time.

## 2016-12-29 NOTE — ED Notes (Signed)
Pt given ice pack for c/o migraine.

## 2017-08-06 DIAGNOSIS — Z029 Encounter for administrative examinations, unspecified: Secondary | ICD-10-CM

## 2019-07-14 IMAGING — DX DG CHEST 1V PORT
1 series · 1 of 1 positions shown · non-contrast
Comparison: 05/02/2016

CLINICAL DATA: Overdose

EXAM:
PORTABLE CHEST 1 VIEW

[chest ap]
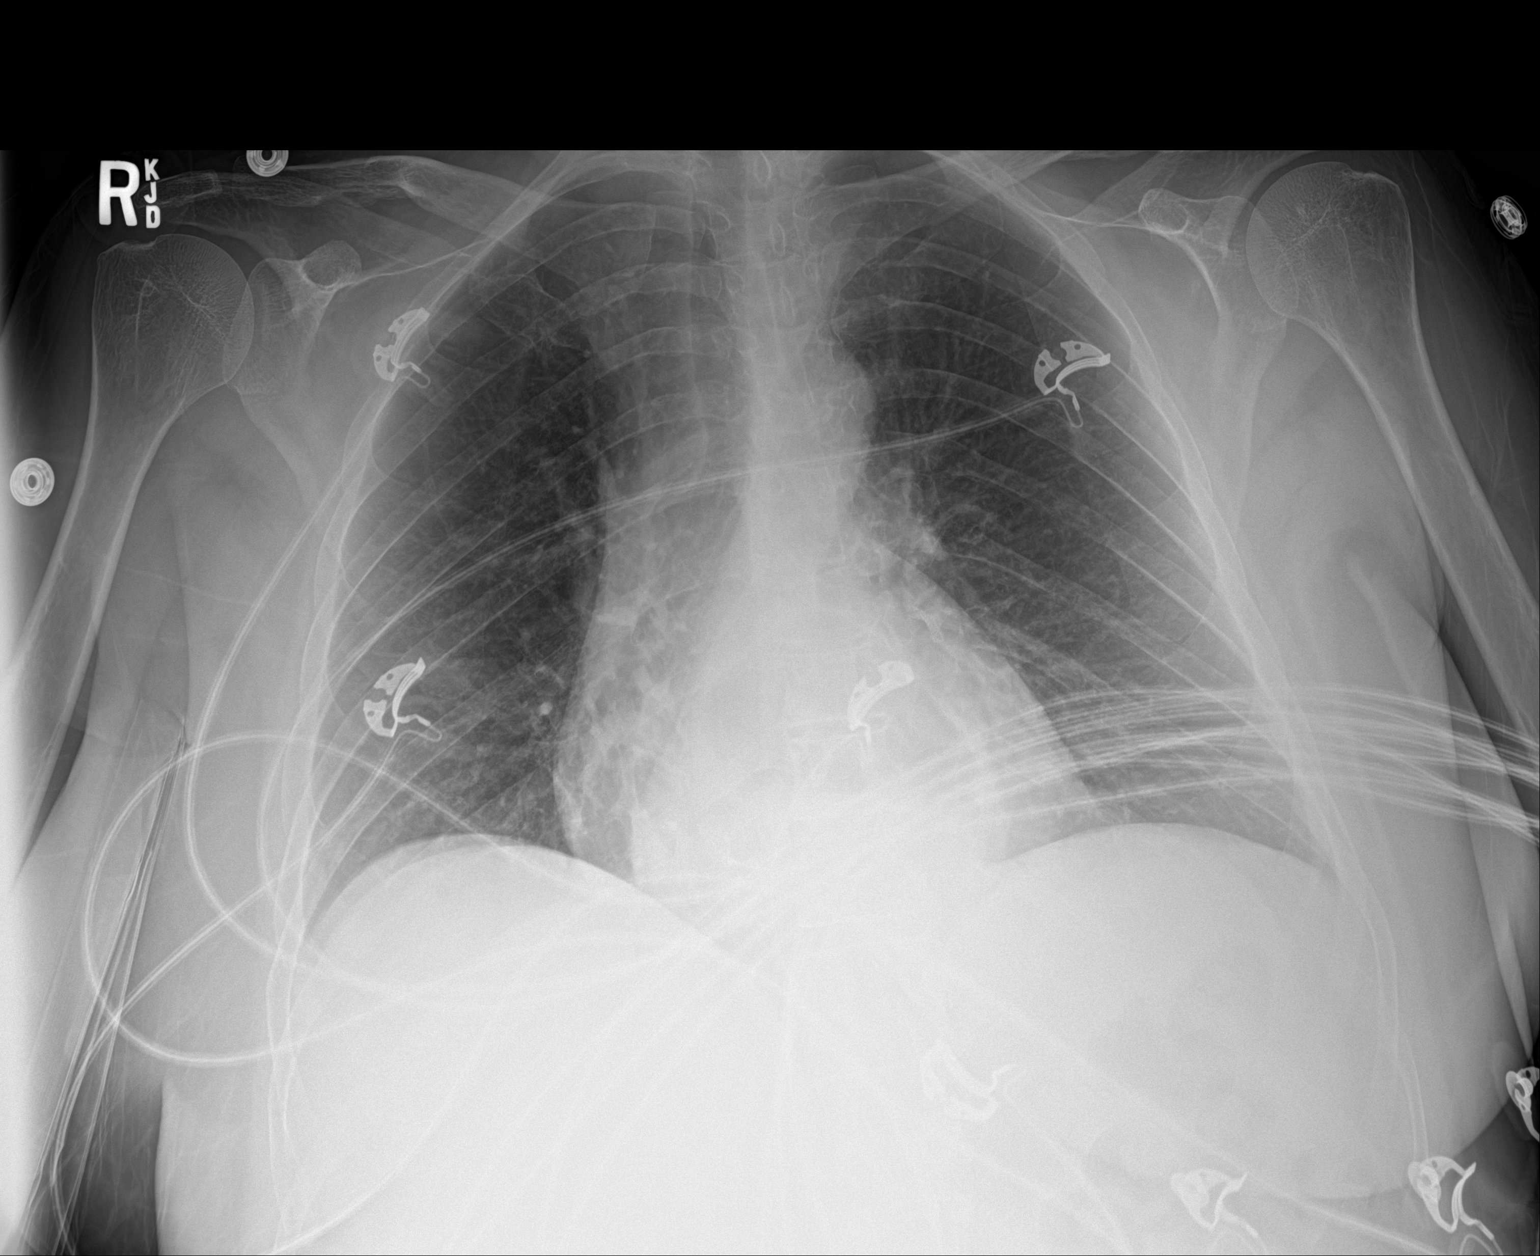

[1 of 1 positions shown; findings below may reference images not displayed]

FINDINGS: Moderate hiatal hernia. No acute consolidation or effusion. Normal
heart size. No pneumothorax.
IMPRESSION: No active disease.  Moderate hiatal hernia

## 2022-08-28 ENCOUNTER — Encounter (HOSPITAL_BASED_OUTPATIENT_CLINIC_OR_DEPARTMENT_OTHER): Payer: Self-pay

## 2022-08-28 ENCOUNTER — Emergency Department (HOSPITAL_BASED_OUTPATIENT_CLINIC_OR_DEPARTMENT_OTHER)
Admission: EM | Admit: 2022-08-28 | Discharge: 2022-08-29 | Disposition: A | Discharge status: Home / Self Care | Payer: Medicare Other | Attending: Emergency Medicine | Admitting: Emergency Medicine

## 2022-08-28 ENCOUNTER — Other Ambulatory Visit: Payer: Self-pay

## 2022-08-28 ENCOUNTER — Emergency Department (HOSPITAL_BASED_OUTPATIENT_CLINIC_OR_DEPARTMENT_OTHER): Payer: Medicare Other

## 2022-08-28 DIAGNOSIS — R1031 Right lower quadrant pain: Secondary | ICD-10-CM | POA: Insufficient documentation

## 2022-08-28 DIAGNOSIS — I10 Essential (primary) hypertension: Secondary | ICD-10-CM | POA: Insufficient documentation

## 2022-08-28 DIAGNOSIS — R109 Unspecified abdominal pain: Secondary | ICD-10-CM

## 2022-08-28 LAB — URINALYSIS, ROUTINE W REFLEX MICROSCOPIC
Bilirubin Urine: NEGATIVE
Glucose, UA: NEGATIVE mg/dL
Hgb urine dipstick: NEGATIVE
Ketones, ur: NEGATIVE mg/dL
Leukocytes,Ua: NEGATIVE
Nitrite: NEGATIVE
Protein, ur: NEGATIVE mg/dL
Specific Gravity, Urine: <1.005 — ABNORMAL LOW (ref 1.005–1.030)
pH: 5.5 (ref 5.0–8.0)

## 2022-08-28 LAB — COMPREHENSIVE METABOLIC PANEL
ALT: 12 U/L (ref 0–44)
AST: 12 U/L — ABNORMAL LOW (ref 15–41)
Albumin: 4.2 g/dL (ref 3.5–5.0)
Alkaline Phosphatase: 54 U/L (ref 38–126)
Anion gap: 11 (ref 5–15)
BUN: 18 mg/dL (ref 8–23)
CO2: 22 mmol/L (ref 22–32)
Calcium: 9.1 mg/dL (ref 8.9–10.3)
Chloride: 107 mmol/L (ref 98–111)
Creatinine, Ser: 0.91 mg/dL (ref 0.44–1.00)
GFR, Estimated: >60 mL/min (ref >60)
Glucose, Bld: 116 mg/dL — ABNORMAL HIGH (ref 70–99)
Potassium: 3.5 mmol/L (ref 3.5–5.1)
Sodium: 140 mmol/L (ref 135–145)
Total Bilirubin: 0.2 mg/dL — ABNORMAL LOW (ref 0.3–1.2)
Total Protein: 6.8 g/dL (ref 6.5–8.1)

## 2022-08-28 LAB — CBC
HCT: 31 % — ABNORMAL LOW (ref 36.0–46.0)
Hemoglobin: 10 g/dL — ABNORMAL LOW (ref 12.0–15.0)
MCH: 27.5 pg (ref 26.0–34.0)
MCHC: 32.3 g/dL (ref 30.0–36.0)
MCV: 85.2 fL (ref 80.0–100.0)
Platelets: 384 10*3/uL (ref 150–400)
RBC: 3.64 MIL/uL — ABNORMAL LOW (ref 3.87–5.11)
RDW: 14.8 % (ref 11.5–15.5)
WBC: 7.9 10*3/uL (ref 4.0–10.5)
nRBC: 0 % (ref 0.0–0.2)

## 2022-08-28 LAB — LIPASE, BLOOD: Lipase: 49 U/L (ref 11–51)

## 2022-08-28 NOTE — ED Triage Notes (Signed)
Patient here POV from Home.  Endorses Right Flank and Right ABD Pain. Worsening over a few days.   No known fevers. Some Diarrhea. frequency noted.   NAD Noted during Triage. A&Ox4. Gcs 15. Ambulatory.

## 2022-08-29 NOTE — ED Provider Notes (Signed)
[facility] EMERGENCY DEPARTMENT AT [facility] Provider Note   CSN: [id number] Arrival date & time: 08/28/22  2247     History  Chief Complaint  Patient presents with   Flank Pain    Heather Cruz is a 62 y.o. female.  HPI     This is a 62 year old female who presents with concern for possible kidney stone.  Patient reports that she has had right flank and abdominal pain worsening over the last several days.  Pain started on Sunday.  She describes a tinge of back pain that now radiates into the right lower quadrant.  She has had kidney stones before that felt similar.  She has had some nausea without vomiting.  No fevers.  No hematuria or dysuria.  States that last week she generally felt under the weather and thought "I had a bug."  Home Medications Prior to Admission medications   Medication Sig Start Date End Date Taking? Authorizing Provider  acetaminophen (TYLENOL) 325 MG tablet Take 1,000 mg by mouth 2 (two) times daily.    [provider]  ALPRAZolam Prudy Feeler) 1 MG tablet Take 1 mg by mouth 3 (three) times daily as needed for anxiety.    [provider]  diphenhydrAMINE (BENADRYL) 25 MG tablet Take 50 mg by mouth 2 (two) times daily.    [provider]  DULoxetine (CYMBALTA) 60 MG capsule TAKE ONE CAPSULE BY MOUTH ONCE DAILY 12/15/15   Campbell Riches, NP  HYDROcodone-acetaminophen (NORCO/VICODIN) 5-325 MG tablet Take 1 tablet by mouth every 6 (six) hours as needed for moderate pain.    [provider]  lisinopril (PRINIVIL,ZESTRIL) 5 MG tablet Take 1 tablet (5 mg total) by mouth daily. 01/14/15   Merlyn Albert, MD  pantoprazole (PROTONIX) 40 MG tablet TAKE ONE TABLET BY MOUTH ONCE DAILY 11/23/16   Merlyn Albert, MD  propranolol (INDERAL) 40 MG tablet TAKE ONE TABLET BY MOUTH TWICE DAILY 12/15/15   Campbell Riches, NP      Allergies    Ciprofloxacin, Doxycycline, Penicillins, and Sulfa antibiotics    Review of  Systems   Review of Systems  Constitutional:  Negative for fever.  Respiratory:  Negative for shortness of breath.   Cardiovascular:  Negative for chest pain.  Genitourinary:  Positive for flank pain. Negative for hematuria.  All other systems reviewed and are negative.   Physical Exam Updated Vital Signs BP (!) 147/101 (BP Location: Right Arm)   Pulse 80   Temp 98.5 F (36.9 C) (Oral)   Resp 18   Ht 1.549 m (5\' 1" )   Wt 57.2 kg   SpO2 100%   BMI 23.81 kg/m  Physical Exam Vitals and nursing note reviewed.  Constitutional:      Appearance: She is well-developed. She is not ill-appearing.  HENT:     Head: Normocephalic and atraumatic.  Eyes:     Pupils: Pupils are equal, round, and reactive to light.  Cardiovascular:     Rate and Rhythm: Normal rate and regular rhythm.     Heart sounds: Normal heart sounds.  Pulmonary:     Effort: Pulmonary effort is normal. No respiratory distress.     Breath sounds: No wheezing.  Abdominal:     General: Bowel sounds are normal.     Palpations: Abdomen is soft.     Tenderness: There is no abdominal tenderness. There is no right CVA tenderness, left CVA tenderness, guarding or rebound.  Musculoskeletal:     Cervical  back: Neck supple.  Skin:    General: Skin is warm and dry.  Neurological:     Mental Status: She is alert and oriented to person, place, and time.  Psychiatric:        Mood and Affect: Mood normal.     ED Results / Procedures / Treatments   Labs (all labs ordered are listed, but only abnormal results are displayed) Labs Reviewed  COMPREHENSIVE METABOLIC PANEL - Abnormal; Notable for the following components:      Result Value   Glucose, Bld 116 (*)    AST 12 (*)    Total Bilirubin 0.2 (*)    All other components within normal limits  CBC - Abnormal; Notable for the following components:   RBC 3.64 (*)    Hemoglobin 10.0 (*)    HCT 31.0 (*)    All other components within normal limits  URINALYSIS, ROUTINE W  REFLEX MICROSCOPIC - Abnormal; Notable for the following components:   Color, Urine COLORLESS (*)    Specific Gravity, Urine <1.005 (*)    All other components within normal limits  LIPASE, BLOOD    EKG None  Radiology CT Renal Stone Study  Result Date: 08/28/2022 CLINICAL DATA:  Abdominal/flank pain, stone suspected. EXAM: CT ABDOMEN AND PELVIS WITHOUT CONTRAST TECHNIQUE: Multidetector CT imaging of the abdomen and pelvis was performed following the standard protocol without IV contrast. RADIATION DOSE REDUCTION: This exam was performed according to the departmental dose-optimization program which includes automated exposure control, adjustment of the mA and/or kV according to patient size and/or use of iterative reconstruction technique. COMPARISON:  CT examination dated Aug 01, 2019 FINDINGS: Lower chest: Large hiatal hernia, unchanged. Hepatobiliary: No focal liver abnormality is seen. Status post cholecystectomy. No biliary dilatation. Pancreas: Unremarkable. No pancreatic ductal dilatation or surrounding inflammatory changes. Spleen: Normal in size without focal abnormality. Adrenals/Urinary Tract: Adrenal glands are unremarkable. Kidneys are normal, without renal calculi, focal lesion, or hydronephrosis. Bladder is unremarkable. Stomach/Bowel: Stomach is within normal limits. Appendix appears normal. No evidence of bowel wall thickening, distention, or inflammatory changes. Vascular/Lymphatic: Aortic atherosclerosis. No enlarged abdominal or pelvic lymph nodes. Reproductive: Status post hysterectomy. No adnexal masses. Other: No abdominal wall hernia or abnormality. No abdominopelvic ascites. Musculoskeletal: No acute or significant osseous findings. IMPRESSION: 1. No evidence of nephrolithiasis or hydronephrosis. 2. Large hiatal hernia, unchanged. 3. No evidence of acute intra-abdominal or pelvic process. 4. Aortic atherosclerosis. Aortic Atherosclerosis (ICD10-I70.0). Electronically Signed    By: Larose Hires D.O.   On: 08/28/2022 23:53    Procedures Procedures    Medications Ordered in ED Medications - No data to display  ED Course/ Medical Decision Making/ A&P                             Medical Decision Making Amount and/or Complexity of Data Reviewed Labs: ordered. Radiology: ordered.   This patient presents to the ED for concern of right flank and abdominal pain, this involves an extensive number of treatment options, and is a complaint that carries with it a high risk of complications and morbidity.  I considered the following differential and admission for this acute, potentially life threatening condition.  The differential diagnosis includes kidney stone, pyelonephritis, appendicitis  MDM:    This is a 62 year old female who presents with right flank and abdominal pain.  She is overall nontoxic and vital signs are notable for blood pressure of 147/101.  She has no  significant tenderness on exam.  She is generally well-appearing.  Labs notable for normal white count.  No significant metabolic derangements.  LFTs and lipase are normal.  Urinalysis is not consistent with UTI.  CT stone study obtained.  This does not show any evidence of kidney stones and is largely unremarkable.  No obvious source of the patient's symptoms.  On recheck, she stable.  Abdominal exam remains fairly benign.  Patient does admit that she may have "passed something the last, went to the bathroom."  She could have some residual ureteral spasm.  (Labs, imaging, consults)  Labs: I Ordered, and personally interpreted labs.  The pertinent results include: CBC, CMP, lipase, urinalysis  Imaging Studies ordered: I ordered imaging studies including CT stone study I independently visualized and interpreted imaging. I agree with the radiologist interpretation  Additional history obtained from chart review.  External records from outside source obtained and reviewed including prior  evaluations  Cardiac Monitoring: The patient was maintained on a cardiac monitor.  If on the cardiac monitor, I personally viewed and interpreted the cardiac monitored which showed an underlying rhythm of: Sinus rhythm  Reevaluation: After the interventions noted above, I reevaluated the patient and found that they have :improved  Social Determinants of Health:  lives independently  Disposition: Discharge  Co morbidities that complicate the patient evaluation  Past Medical History:  Diagnosis Date   Back pain    Depression    GERD (gastroesophageal reflux disease)    Hyperlipidemia    Hypertension    Migraine    Perennial allergic rhinitis    PSVT (paroxysmal supraventricular tachycardia)    Seasonal allergies    Sleep apnea    Substance abuse (HCC)    pain medication   Tachycardia      Medicines No orders of the defined types were placed in this encounter.   I have reviewed the patients home medicines and have made adjustments as needed  Problem List / ED Course: Problem List Items Addressed This Visit   None Visit Diagnoses     Right flank pain    -  Primary                   Final Clinical Impression(s) / ED Diagnoses Final diagnoses:  Right flank pain    Rx / DC Orders ED Discharge Orders     None         Shon Baton, MD 08/29/22 [phone]

## 2022-08-29 NOTE — Discharge Instructions (Signed)
You were seen today for abdominal and flank pain.  Your CT scan and lab workup is largely unremarkable.  Make sure that you are staying hydrated.  Monitor your symptoms closely.  If you have any new or worsening symptoms, you should be reevaluated.

## 2023-11-20 NOTE — Progress Notes (Signed)
 TELEHEALTH AUDIO VISIT [facility]  - INTERNAL MEDICINE Heather Cruz DOB: Jan 19, 1961  MRN: [id number]  Visit Date: 11/20/2023 Encounter Provider: Tinnie Almarie Forts, PA-C  PCP: Katheryn LITTIE Billing, PA-C  Location Information: Patient State (at time of visit): [place]  Patient Location (at time of visit):Home/Other Non-Medical  Provider Location: Hospital/Provider-Based Clinic Is provider licensed to provide clinical care in the current location/state of the patient? Yes   Consent:  Patient's identity was confirmed. Presenting condition or illness was discussed with the patient/personal representative. Current proposed treatment for presenting condition or illness was explained to patient/personal representative along with the likely benefits and any significant risks or complications associated with the provision of treatment by audio/video means. The patient/personal representative verbally authorized treatment to be provided by audio/video, which may include a limited review of patient's current health status, medication, or other treatment recommendations, patient education, and an opportunity to ask questions about condition and treatment. Verbal Consent Granted by Patient/Personal Representative:Yes   Visit Information: Modality: 2-Way Real-Time Audio/Video  Video Start Time: 1:40 Video Stop Time: 1:46 Video Total Time: 6 min    Assessment/Plan:     1. Bronchitis (Primary) Start zpack. Albuterol sent prn for wheezing; reports this is mild and declines any steroids at this time. Advised patient to get plenty of rest and fluids.  Encouraged taking all medication until finished.  Follow up if symptoms not improving in time frame discussed. - albuterol HFA (PROVENTIL HFA;VENTOLIN HFA;PROAIR HFA) 90 mcg/actuation inhaler; Inhale 2 puffs every 6 (six) hours as needed for wheezing.  Dispense: 1 each; Refill: 2 - azithromycin (ZITHROMAX) 250  mg tablet; Take 2 tablets (500 mg) on day 1, followed by 250 mg on days 2 to 5  Dispense: 6 tablet; Refill: 0  2. Tobacco use Increased risk of secondary bacterial infection. Likely has some underlying emphysema given 40 pack year history.   Subjective:     Patient ID:  Heather Cruz is a 63 y.o. female   Heather Cruz is a 63 y.o. female who complains of productive cough (green mucous), wheezing, rhinorrhea, sore throat, and post nasal drainge for 8-9 days. She denies a history of anorexia, chest pain, chills, dizziness, fevers, myalgias, nausea, shortness of breath, sweats, and vomiting and denies a history of asthma/COPD. Sympoms are worsening. Tobacco use: Current tobacco user (cigarettes) (40 pack year history). Medications tried include: none.  The following portions of the patient's history were reviewed and updated as appropriate: Problem List[1]  Medications Ordered Prior to Encounter[2]   Review of Systems  Complete ROS negative except for those noted in HPI  Vitals There were no vitals filed for this visit.  PE Limited by virtual visit. Well appearing. Speaking in full sentences without need to pause. No acute distress. Breathing is unlabored.  MDM: Recent lab work reviewed: Lab Results  Component Value Date   WBC 6.90 08/16/2023   HGB 13.2 08/16/2023   HCT 39.5 08/16/2023   PLT 351 08/16/2023   CHOL 169 08/16/2023   TRIG 162 (H) 08/16/2023   HDL 50 (L) 08/16/2023   ALT 13 08/16/2023   AST 14 08/16/2023   NA 143 08/16/2023   K 3.7 08/16/2023   CL 108 (H) 08/16/2023   CREATININE 0.79 08/16/2023   BUN 13 08/16/2023   CO2 27 08/16/2023   TSH 1.329 08/16/2023   VITD 54.1 08/16/2023    Recent imaging reviewed: EKG - PERFORMED DURING CLINIC VISIT Ventricular Rate  79        BPM                  Atrial Rate                        79        BPM                  P-R Interval                       130       ms                   QRS  Duration                       84        ms                   Q-T Interval                       404       ms                   QTC                                463       ms                   P Axis                             39        degrees              R Axis                             38        degrees              T Axis                             105       degrees               Sinus rhythm  Nonspecific ST and T wave abnormality  Prolonged QT interval, consider electrolyte imbalance or drug effects  No previous ECGs available  Confirmed by Nolene Agent  [id number]  on 07-30-2019 8 07 49 AM       Electronically signed by: Tinnie Almarie Forts, PA-C 11/20/2023 1:10 PM        [1] Patient Active Problem List Diagnosis  . Allergic rhinitis  . Bulging of cervical intervertebral disc  . Bulging of lumbar intervertebral disc  . CAD in native artery  . Depression  . Dyslipidemia  . Essential hypertension  . Gastroesophageal reflux disease without esophagitis  . H/O cardiac radiofrequency ablation  . H/O heart artery stent  . Hiatal hernia  . Hyperlipidemia  . Migraines  . PSVT (paroxysmal supraventricular tachycardia)  . Severe episode of recurrent major depressive disorder, without psychotic features (HCC)  . Tobacco use  . Hiatal hernia  with GERD  . Iron deficiency anemia  . B12 deficiency  [2] Current Outpatient Medications on File Prior to Visit  Medication Sig Dispense Refill  . aspirin 81 mg EC tablet Take 1 tablet (81 mg total) by mouth daily.    SABRA atorvastatin (LIPITOR) 80 mg tablet Take 1 tablet by mouth once daily 90 tablet 2  . betamethasone dipropionate (DIPROSONE) 0.05 % lotion Apply topically 2 (two) times a day. 60 mL 5  . diphenhydrAMINE  (BENADRYL ) 50 mg capsule Take 1 capsule by mouth 2 (two) times a day.    . DULoxetine  (CYMBALTA ) 30 mg capsule Take 1 capsule (30 mg total) by mouth daily. With 60 mg capsule. 90 capsule 2  . DULoxetine   (CYMBALTA ) 60 mg capsule TAKE 1 CAPSULE BY MOUTH ONCE DAILY WITH A 30 MG CAPSULE. 90 capsule 2  . ergocalciferol (Vitamin D2) 1,250 mcg (50,000 unit) capsule Take 1 capsule (50,000 Units total) by mouth once a week. 12 capsule 3  . fenofibrate nanocrystallized (TRICOR) 145 mg tablet Take 1 tablet by mouth once daily 90 tablet 2  . fluticasone propionate (FLONASE) 50 mcg/spray nasal spray 1 spray 2 (two) times a day for 14 days. 1 Inhaler 0  . ondansetron  (ZOFRAN -ODT) 4 mg disintegrating tablet Dissolve 1 tablet (4 mg total) on tongue every 8 (eight) hours as needed for nausea or vomiting. 30 tablet 3  . pantoprazole  (PROTONIX ) 40 mg EC tablet Take 1 tablet by mouth once daily 90 tablet 2  . propranoloL  (INDERAL ) 40 mg tablet Take 1 tablet by mouth twice daily 180 tablet 2  . valsartan (DIOVAN) 160 mg tablet Take 1 tablet (160 mg total) by mouth daily. 90 tablet 3   No current facility-administered medications on file prior to visit.

## 2024-02-03 ENCOUNTER — Emergency Department (HOSPITAL_COMMUNITY)
Admission: EM | Admit: 2024-02-03 | Discharge: 2024-02-04 | Disposition: A | Attending: Emergency Medicine | Admitting: Emergency Medicine

## 2024-02-03 ENCOUNTER — Emergency Department (HOSPITAL_COMMUNITY)

## 2024-02-03 ENCOUNTER — Other Ambulatory Visit: Payer: Self-pay

## 2024-02-03 ENCOUNTER — Encounter (HOSPITAL_COMMUNITY): Payer: Self-pay | Admitting: Radiology

## 2024-02-03 DIAGNOSIS — R0602 Shortness of breath: Secondary | ICD-10-CM | POA: Insufficient documentation

## 2024-02-03 DIAGNOSIS — N200 Calculus of kidney: Secondary | ICD-10-CM | POA: Insufficient documentation

## 2024-02-03 DIAGNOSIS — R10A1 Flank pain, right side: Secondary | ICD-10-CM | POA: Diagnosis present

## 2024-02-03 DIAGNOSIS — R600 Localized edema: Secondary | ICD-10-CM | POA: Diagnosis not present

## 2024-02-03 DIAGNOSIS — N23 Unspecified renal colic: Secondary | ICD-10-CM

## 2024-02-03 LAB — URINALYSIS, ROUTINE W REFLEX MICROSCOPIC
Bilirubin Urine: NEGATIVE
Glucose, UA: NEGATIVE mg/dL
Hgb urine dipstick: NEGATIVE
Ketones, ur: NEGATIVE mg/dL
Nitrite: NEGATIVE
Protein, ur: NEGATIVE mg/dL
Specific Gravity, Urine: 1.009 (ref 1.005–1.030)
pH: 6 (ref 5.0–8.0)

## 2024-02-03 LAB — COMPREHENSIVE METABOLIC PANEL WITH GFR
ALT: 20 U/L (ref 0–44)
AST: 20 U/L (ref 15–41)
Albumin: 3.7 g/dL (ref 3.5–5.0)
Alkaline Phosphatase: 51 U/L (ref 38–126)
Anion gap: 10 (ref 5–15)
BUN: 15 mg/dL (ref 8–23)
CO2: 25 mmol/L (ref 22–32)
Calcium: 9.4 mg/dL (ref 8.9–10.3)
Chloride: 105 mmol/L (ref 98–111)
Creatinine, Ser: 0.81 mg/dL (ref 0.44–1.00)
GFR, Estimated: >60 mL/min (ref >60)
Glucose, Bld: 98 mg/dL (ref 70–99)
Potassium: 3.9 mmol/L (ref 3.5–5.1)
Sodium: 140 mmol/L (ref 135–145)
Total Bilirubin: 0.5 mg/dL (ref 0.0–1.2)
Total Protein: 6.7 g/dL (ref 6.5–8.1)

## 2024-02-03 LAB — CBC WITH DIFFERENTIAL/PLATELET
Abs Immature Granulocytes: 0.03 K/uL (ref 0.00–0.07)
Basophils Absolute: 0 K/uL (ref 0.0–0.1)
Basophils Relative: 1 %
Eosinophils Absolute: 0.2 K/uL (ref 0.0–0.5)
Eosinophils Relative: 3 %
HCT: 36.2 % (ref 36.0–46.0)
Hemoglobin: 11.6 g/dL — ABNORMAL LOW (ref 12.0–15.0)
Immature Granulocytes: 0 %
Lymphocytes Relative: 30 %
Lymphs Abs: 2.3 K/uL (ref 0.7–4.0)
MCH: 30 pg (ref 26.0–34.0)
MCHC: 32 g/dL (ref 30.0–36.0)
MCV: 93.5 fL (ref 80.0–100.0)
Monocytes Absolute: 0.4 K/uL (ref 0.1–1.0)
Monocytes Relative: 6 %
Neutro Abs: 4.6 K/uL (ref 1.7–7.7)
Neutrophils Relative %: 60 %
Platelets: 282 K/uL (ref 150–400)
RBC: 3.87 MIL/uL (ref 3.87–5.11)
RDW: 12.7 % (ref 11.5–15.5)
WBC: 7.6 K/uL (ref 4.0–10.5)
nRBC: 0 % (ref 0.0–0.2)

## 2024-02-03 LAB — TROPONIN I (HIGH SENSITIVITY): Troponin I (High Sensitivity): 5 ng/L (ref <18)

## 2024-02-03 LAB — BRAIN NATRIURETIC PEPTIDE: B Natriuretic Peptide: 388.6 pg/mL — ABNORMAL HIGH (ref 0.0–100.0)

## 2024-02-03 LAB — LIPASE, BLOOD: Lipase: 34 U/L (ref 11–51)

## 2024-02-03 MED ORDER — KETOROLAC TROMETHAMINE 15 MG/ML IJ SOLN
15.0000 mg | Freq: Once | INTRAMUSCULAR | Status: AC
  Administered 2024-02-03: 15 mg via INTRAVENOUS
  Filled 2024-02-03: qty 1

## 2024-02-03 NOTE — ED Triage Notes (Signed)
 Pt endorses that she has had right flank pain since Friday and has had kidney stones in the past. States she has brought some of the kidney stones with her but is still having pain. She also endorses shortness of breath since yesterday, she had a bad episode this am and had to use her inhaler.

## 2024-02-03 NOTE — ED Provider Notes (Incomplete)
 [place] EMERGENCY DEPARTMENT AT [facility] Provider Note   CSN: [id number] Arrival date & time: 02/03/24  1722     Patient presents with: Flank Pain and Shortness of Breath   Heather Cruz is a 63 y.o. female.  History of nephrolithiasis, has previously passed multiple stones presenting with right flank pain for 3 days.  History per patient.  Patient states that she has developed right flank pain 3 days ago, has had multiple kidney stones pass since then.  She brought some of the kidney stents with her, however endorses that she continues to have pain.  Also states that she feels more SOB this morning, used her inhaler this morning with some improvement.  States that she does not really feel short of breath at this time, denies chest pain.  Denies fever or chill, nausea or vomiting.  Patient reports that she primarily went to the ER because she thinks that we can do a full workup for everything.  States that she continues to have right flank pain, brought in a cup with numerous stones, which she states she has been passing.  {Add pertinent medical, surgical, social history, OB history to HPI:32947}  Flank Pain Associated symptoms include shortness of breath.  Shortness of Breath      Prior to Admission medications   Medication Sig Start Date End Date Taking? Authorizing Provider  acetaminophen  (TYLENOL ) 325 MG tablet Take 1,000 mg by mouth 2 (two) times daily.    [provider]  ALPRAZolam  (XANAX ) 1 MG tablet Take 1 mg by mouth 3 (three) times daily as needed for anxiety.    [provider]  diphenhydrAMINE  (BENADRYL ) 25 MG tablet Take 50 mg by mouth 2 (two) times daily.    [provider]  DULoxetine  (CYMBALTA ) 60 MG capsule TAKE ONE CAPSULE BY MOUTH ONCE DAILY 12/15/15   Hoskins, Carolyn C, NP  HYDROcodone -acetaminophen  (NORCO/VICODIN) 5-325 MG tablet Take 1 tablet by mouth every 6 (six) hours as needed for moderate pain.    [provider]  lisinopril  (PRINIVIL ,ZESTRIL ) 5 MG tablet Take 1 tablet (5 mg total) by mouth daily. 01/14/15   Alphonsa Elsie RAMAN, MD  pantoprazole  (PROTONIX ) 40 MG tablet TAKE ONE TABLET BY MOUTH ONCE DAILY 11/23/16   Alphonsa Elsie RAMAN, MD  propranolol  (INDERAL ) 40 MG tablet TAKE ONE TABLET BY MOUTH TWICE DAILY 12/15/15   Hoskins, Carolyn C, NP    Allergies: Ciprofloxacin, Doxycycline , Penicillins, and Sulfa antibiotics    Review of Systems  Respiratory:  Positive for shortness of breath.   Genitourinary:  Positive for flank pain.    Updated Vital Signs BP (!) 173/92   Pulse 84   Temp (!) 97.5 F (36.4 C)   Resp 18   Ht 5' 1 (1.549 m)   Wt 56.2 kg   SpO2 99%   BMI 23.43 kg/m   Physical Exam Vitals and nursing note reviewed.  Constitutional:      General: She is not in acute distress.    Appearance: She is well-developed.  HENT:     Head: Normocephalic and atraumatic.     Mouth/Throat:     Mouth: Mucous membranes are moist.     Pharynx: Oropharynx is clear.  Eyes:     Conjunctiva/sclera: Conjunctivae normal.  Cardiovascular:     Rate and Rhythm: Normal rate and regular rhythm.     Pulses: Normal pulses.     Heart sounds: Murmur heard.     Comments: Harsh blowing murmur present  over left sternal listening post Pulmonary:     Effort: Pulmonary effort is normal. No respiratory distress.     Breath sounds: Normal breath sounds. No decreased breath sounds, wheezing, rhonchi or rales.  Abdominal:     Palpations: Abdomen is soft.     Tenderness: There is no abdominal tenderness. There is no guarding or rebound.  Musculoskeletal:        General: No swelling.     Cervical back: Normal range of motion and neck supple.     Right lower leg: Edema present.     Left lower leg: Edema present.     Comments: 1+ pitting edema bilateral lower extremities  Skin:    General: Skin is warm and dry.     Capillary Refill: Capillary refill takes less than 2 seconds.  Neurological:     General: No  focal deficit present.     Mental Status: She is alert and oriented to person, place, and time.  Psychiatric:        Mood and Affect: Mood normal.     (all labs ordered are listed, but only abnormal results are displayed) Labs Reviewed  CBC WITH DIFFERENTIAL/PLATELET - Abnormal; Notable for the following components:      Result Value   Hemoglobin 11.6 (*)    All other components within normal limits  URINALYSIS, ROUTINE W REFLEX MICROSCOPIC - Abnormal; Notable for the following components:   Leukocytes,Ua MODERATE (*)    Bacteria, UA RARE (*)    All other components within normal limits  COMPREHENSIVE METABOLIC PANEL WITH GFR  LIPASE, BLOOD  BRAIN NATRIURETIC PEPTIDE    EKG: None  Radiology: CT Renal Stone Study Result Date: 02/03/2024 CLINICAL DATA:  Flank pain. EXAM: CT ABDOMEN AND PELVIS WITHOUT CONTRAST TECHNIQUE: Multidetector CT imaging of the abdomen and pelvis was performed following the standard protocol without IV contrast. RADIATION DOSE REDUCTION: This exam was performed according to the departmental dose-optimization program which includes automated exposure control, adjustment of the mA and/or kV according to patient size and/or use of iterative reconstruction technique. COMPARISON:  August 28, 2022 FINDINGS: Lower chest: No acute abnormality. Hepatobiliary: No focal liver abnormality is seen. Status post cholecystectomy. No biliary dilatation. Pancreas: Unremarkable. No pancreatic ductal dilatation or surrounding inflammatory changes. Spleen: Normal in size without focal abnormality. Adrenals/Urinary Tract: Adrenal glands are unremarkable. Kidneys are normal, without obstructing renal calculi, focal lesion, or hydronephrosis. A punctate nonobstructing renal calculus is seen within the mid to upper left kidney. Bladder is unremarkable. Stomach/Bowel: There is a large, stable hiatal hernia. Appendix appears normal. No evidence of bowel wall thickening, distention, or  inflammatory changes. Noninflamed diverticula are seen within the mid to distal sigmoid colon. Vascular/Lymphatic: Aortic atherosclerosis. No enlarged abdominal or pelvic lymph nodes. Reproductive: Status post hysterectomy. No adnexal masses. Other: No abdominal wall hernia or abnormality. No abdominopelvic ascites. Musculoskeletal: A chronic compression fracture deformity is seen at the level of T11 and posterior aspect of L3. No acute osseous abnormalities are identified. IMPRESSION: 1. Large, stable hiatal hernia. 2. Sigmoid diverticulosis. 3. Chronic compression fracture deformities at the level of T11 and L3. 4. Aortic atherosclerosis. Electronically Signed   By: Suzen Dials M.D.   On: 02/03/2024 20:12    {Document cardiac monitor, telemetry assessment procedure when appropriate:32947} Procedures   Medications Ordered in the ED - No data to display    {Click here for ABCD2, HEART and other calculators REFRESH Note before signing:1}  Medical Decision Making Amount and/or Complexity of Data Reviewed Labs: ordered. Radiology: ordered.  Risk Prescription drug management.   ***  {Document critical care time when appropriate  Document review of labs and clinical decision tools ie CHADS2VASC2, etc  Document your independent review of radiology images and any outside records  Document your discussion with family members, caretakers and with consultants  Document social determinants of health affecting pt's care  Document your decision making why or why not admission, treatments were needed:32947:::1}   Final diagnoses:  None    ED Discharge Orders     None

## 2024-02-03 NOTE — ED Provider Notes (Signed)
 Care of patient received from prior provider at 11:34 PM, please see their note for complete H/P and care plan.  Received handoff per ED course.  Clinical Course as of 02/03/24 2334  Mon Feb 03, 2024  2318 Pt feeling significantly improved, f/u repeat trop, then stable for d/c [BS]  2333 Stable  63 YOF with flank pain/SOB Recently passed UL Pending delta troponin due to atypical pain CT without obstructive disease [CC]    Clinical Course User Index [BS] Arlee Katz, MD [CC] Jerral Meth, MD    Reassessment: Pain completely resolved, observed 8 hours.  Serial troponins negative.  Patient ambulatory tolerating p.o. intake feels comfortable discharge follow-up with urology in the outpatient setting.     Jerral Meth, MD 02/04/24 [phone]

## 2024-02-03 NOTE — ED Provider Triage Note (Signed)
 Emergency Medicine Provider Triage Evaluation Note  DASHANTI BURR , a 63 y.o. female  was evaluated in triage.  Pt complains of flank pain. R side flank pain x 4 days.  Have been passing kidney stone but still having pain.  Also report sob for more than a week.  No fever, chills, cough.  Some nausea without vomiting or diahea  Review of Systems  Positive: As above Negative: As above  Physical Exam  BP (!) 173/92   Pulse 84   Temp (!) 97.5 F (36.4 C)   Resp 18   Ht 5' 1 (1.549 m)   Wt 56.2 kg   SpO2 99%   BMI 23.43 kg/m  Gen:   Awake, no distress   Resp:  Normal effort  MSK:   Moves extremities without difficulty  Other:    Medical Decision Making  Medically screening exam initiated at 6:41 PM.  Appropriate orders placed.  Sandy Haye Dolson was informed that the remainder of the evaluation will be completed by another provider, this initial triage assessment does not replace that evaluation, and the importance of remaining in the ED until their evaluation is complete.     Nivia Colon, PA-C 02/03/24 [phone]

## 2024-02-04 LAB — TROPONIN I (HIGH SENSITIVITY)
Troponin I (High Sensitivity): 4 ng/L (ref <18)
Troponin I (High Sensitivity): 4 ng/L (ref <18)

## 2024-02-04 MED ORDER — TAMSULOSIN HCL 0.4 MG PO CAPS: 0.4000 mg | ORAL_CAPSULE | Freq: Every day | ORAL | 0 refills | Status: AC

## 2024-02-04 NOTE — ED Notes (Signed)
 Three Troponins were drawn

## 2024-03-26 ENCOUNTER — Encounter (HOSPITAL_COMMUNITY): Payer: Self-pay

## 2024-03-26 ENCOUNTER — Observation Stay (HOSPITAL_COMMUNITY)

## 2024-03-26 ENCOUNTER — Other Ambulatory Visit: Payer: Self-pay

## 2024-03-26 ENCOUNTER — Observation Stay (HOSPITAL_COMMUNITY)
Admission: EM | Admit: 2024-03-26 | Discharge: 2024-03-27 | Disposition: A | Discharge status: Home / Self Care | Attending: Emergency Medicine | Admitting: Emergency Medicine

## 2024-03-26 ENCOUNTER — Emergency Department (HOSPITAL_COMMUNITY)

## 2024-03-26 DIAGNOSIS — I959 Hypotension, unspecified: Principal | ICD-10-CM

## 2024-03-26 DIAGNOSIS — R55 Syncope and collapse: Secondary | ICD-10-CM | POA: Insufficient documentation

## 2024-03-26 DIAGNOSIS — F17210 Nicotine dependence, cigarettes, uncomplicated: Secondary | ICD-10-CM | POA: Diagnosis not present

## 2024-03-26 DIAGNOSIS — Z79899 Other long term (current) drug therapy: Secondary | ICD-10-CM | POA: Insufficient documentation

## 2024-03-26 DIAGNOSIS — R0602 Shortness of breath: Secondary | ICD-10-CM | POA: Insufficient documentation

## 2024-03-26 DIAGNOSIS — Z955 Presence of coronary angioplasty implant and graft: Secondary | ICD-10-CM | POA: Diagnosis not present

## 2024-03-26 DIAGNOSIS — R079 Chest pain, unspecified: Secondary | ICD-10-CM | POA: Diagnosis not present

## 2024-03-26 DIAGNOSIS — F39 Unspecified mood [affective] disorder: Secondary | ICD-10-CM | POA: Insufficient documentation

## 2024-03-26 DIAGNOSIS — R9431 Abnormal electrocardiogram [ECG] [EKG]: Secondary | ICD-10-CM | POA: Diagnosis not present

## 2024-03-26 DIAGNOSIS — I25118 Atherosclerotic heart disease of native coronary artery with other forms of angina pectoris: Secondary | ICD-10-CM

## 2024-03-26 DIAGNOSIS — N179 Acute kidney failure, unspecified: Secondary | ICD-10-CM | POA: Insufficient documentation

## 2024-03-26 DIAGNOSIS — R0789 Other chest pain: Principal | ICD-10-CM | POA: Insufficient documentation

## 2024-03-26 DIAGNOSIS — I2510 Atherosclerotic heart disease of native coronary artery without angina pectoris: Secondary | ICD-10-CM | POA: Insufficient documentation

## 2024-03-26 DIAGNOSIS — I10 Essential (primary) hypertension: Secondary | ICD-10-CM | POA: Diagnosis not present

## 2024-03-26 LAB — CBC
HCT: 37.3 % (ref 36.0–46.0)
HCT: 36.3 % (ref 36.0–46.0)
Hemoglobin: 12 g/dL (ref 12.0–15.0)
Hemoglobin: 11.9 g/dL — ABNORMAL LOW (ref 12.0–15.0)
MCH: 29.6 pg (ref 26.0–34.0)
MCH: 30.1 pg (ref 26.0–34.0)
MCHC: 32.2 g/dL (ref 30.0–36.0)
MCHC: 32.8 g/dL (ref 30.0–36.0)
MCV: 91.9 fL (ref 80.0–100.0)
MCV: 91.9 fL (ref 80.0–100.0)
Platelets: 328 K/uL (ref 150–400)
Platelets: 327 K/uL (ref 150–400)
RBC: 4.06 MIL/uL (ref 3.87–5.11)
RBC: 3.95 MIL/uL (ref 3.87–5.11)
RDW: 13.7 % (ref 11.5–15.5)
RDW: 13.8 % (ref 11.5–15.5)
WBC: 8.7 K/uL (ref 4.0–10.5)
WBC: 7.3 K/uL (ref 4.0–10.5)
nRBC: 0 % (ref 0.0–0.2)
nRBC: 0 % (ref 0.0–0.2)

## 2024-03-26 LAB — COMPREHENSIVE METABOLIC PANEL WITH GFR
ALT: 18 U/L (ref 0–44)
AST: 22 U/L (ref 15–41)
Albumin: 4 g/dL (ref 3.5–5.0)
Alkaline Phosphatase: 61 U/L (ref 38–126)
Anion gap: 15 (ref 5–15)
BUN: 25 mg/dL — ABNORMAL HIGH (ref 8–23)
CO2: 21 mmol/L — ABNORMAL LOW (ref 22–32)
Calcium: 8.7 mg/dL — ABNORMAL LOW (ref 8.9–10.3)
Chloride: 100 mmol/L (ref 98–111)
Creatinine, Ser: 1.21 mg/dL — ABNORMAL HIGH (ref 0.44–1.00)
GFR, Estimated: 50 mL/min — ABNORMAL LOW
Glucose, Bld: 154 mg/dL — ABNORMAL HIGH (ref 70–99)
Potassium: 3.5 mmol/L (ref 3.5–5.1)
Sodium: 136 mmol/L (ref 135–145)
Total Bilirubin: 0.2 mg/dL (ref 0.0–1.2)
Total Protein: 6.5 g/dL (ref 6.5–8.1)

## 2024-03-26 LAB — URINALYSIS, ROUTINE W REFLEX MICROSCOPIC
Bacteria, UA: NONE SEEN
Bilirubin Urine: NEGATIVE
Glucose, UA: NEGATIVE mg/dL
Ketones, ur: NEGATIVE mg/dL
Nitrite: NEGATIVE
Protein, ur: NEGATIVE mg/dL
Specific Gravity, Urine: 1.005 (ref 1.005–1.030)
pH: 5 (ref 5.0–8.0)

## 2024-03-26 LAB — ECHOCARDIOGRAM COMPLETE
Area-P 1/2: 3.19 cm2
Height: 61 in
MV M vel: 5.4 m/s
MV Peak grad: 116.6 mmHg
S' Lateral: 2.7 cm
Weight: 1984 [oz_av]

## 2024-03-26 LAB — URINE DRUG SCREEN
Amphetamines: NEGATIVE
Barbiturates: NEGATIVE
Benzodiazepines: NEGATIVE
Cocaine: NEGATIVE
Fentanyl: NEGATIVE
Methadone Scn, Ur: NEGATIVE
Opiates: NEGATIVE
Tetrahydrocannabinol: NEGATIVE

## 2024-03-26 LAB — PRO BRAIN NATRIURETIC PEPTIDE: Pro Brain Natriuretic Peptide: 207 pg/mL

## 2024-03-26 LAB — TROPONIN T, HIGH SENSITIVITY
Troponin T High Sensitivity: 6 ng/L (ref 0–19)
Troponin T High Sensitivity: <6 ng/L (ref 0–19)
Troponin T High Sensitivity: <6 ng/L (ref 0–19)

## 2024-03-26 LAB — D-DIMER, QUANTITATIVE: D-Dimer, Quant: 0.33 ug{FEU}/mL (ref 0.00–0.50)

## 2024-03-26 LAB — MAGNESIUM: Magnesium: 1.8 mg/dL (ref 1.7–2.4)

## 2024-03-26 LAB — CREATININE, SERUM
Creatinine, Ser: 0.72 mg/dL (ref 0.44–1.00)
GFR, Estimated: >60 mL/min

## 2024-03-26 MED ORDER — PANTOPRAZOLE SODIUM 40 MG PO TBEC
40.0000 mg | DELAYED_RELEASE_TABLET | Freq: Two times a day (BID) | ORAL | Status: DC
  Administered 2024-03-26 (×2): 40 mg via ORAL
  Filled 2024-03-26 (×2): qty 1

## 2024-03-26 MED ORDER — PANTOPRAZOLE SODIUM 40 MG PO TBEC: 40.0000 mg | DELAYED_RELEASE_TABLET | Freq: Every day | ORAL | Status: DC

## 2024-03-26 MED ORDER — ONDANSETRON HCL 4 MG/2ML IJ SOLN: 4.0000 mg | Freq: Four times a day (QID) | INTRAMUSCULAR | Status: DC | PRN

## 2024-03-26 MED ORDER — POLYETHYLENE GLYCOL 3350 17 G PO PACK: 17.0000 g | PACK | Freq: Every day | ORAL | Status: DC | PRN

## 2024-03-26 MED ORDER — DULOXETINE HCL 30 MG PO CPEP
90.0000 mg | ORAL_CAPSULE | Freq: Every day | ORAL | Status: DC
  Administered 2024-03-26: 90 mg via ORAL
  Filled 2024-03-26: qty 3

## 2024-03-26 MED ORDER — FENOFIBRATE 145 MG PO TABS
145.0000 mg | ORAL_TABLET | Freq: Every day | ORAL | Status: DC
  Filled 2024-03-26: qty 1

## 2024-03-26 MED ORDER — SODIUM CHLORIDE 0.9 % IV BOLUS
1000.0000 mL | Freq: Once | INTRAVENOUS | Status: AC
  Administered 2024-03-26: 1000 mL via INTRAVENOUS

## 2024-03-26 MED ORDER — ACETAMINOPHEN 650 MG RE SUPP: 650.0000 mg | Freq: Four times a day (QID) | RECTAL | Status: DC | PRN

## 2024-03-26 MED ORDER — FENTANYL CITRATE (PF) 50 MCG/ML IJ SOSY
50.0000 ug | PREFILLED_SYRINGE | Freq: Once | INTRAMUSCULAR | Status: AC
  Administered 2024-03-26: 50 ug via INTRAVENOUS
  Filled 2024-03-26: qty 1

## 2024-03-26 MED ORDER — BISACODYL 10 MG RE SUPP: 10.0000 mg | Freq: Every day | RECTAL | Status: DC | PRN

## 2024-03-26 MED ORDER — LACTATED RINGERS IV BOLUS
1000.0000 mL | Freq: Once | INTRAVENOUS | Status: AC
  Administered 2024-03-26: 1000 mL via INTRAVENOUS

## 2024-03-26 MED ORDER — OXYCODONE HCL 5 MG PO TABS: 5.0000 mg | ORAL_TABLET | ORAL | Status: DC | PRN

## 2024-03-26 MED ORDER — HYDROMORPHONE HCL 1 MG/ML IJ SOLN: 0.5000 mg | INTRAMUSCULAR | Status: DC | PRN

## 2024-03-26 MED ORDER — ACETAMINOPHEN 325 MG PO TABS
650.0000 mg | ORAL_TABLET | Freq: Four times a day (QID) | ORAL | Status: DC | PRN
  Administered 2024-03-27: 650 mg via ORAL
  Filled 2024-03-26 (×2): qty 2

## 2024-03-26 MED ORDER — ENOXAPARIN SODIUM 40 MG/0.4ML IJ SOSY
40.0000 mg | PREFILLED_SYRINGE | INTRAMUSCULAR | Status: DC
  Administered 2024-03-26: 40 mg via SUBCUTANEOUS
  Filled 2024-03-26: qty 0.4

## 2024-03-26 MED ORDER — ALBUTEROL SULFATE (2.5 MG/3ML) 0.083% IN NEBU: 2.5000 mg | INHALATION_SOLUTION | RESPIRATORY_TRACT | Status: DC | PRN

## 2024-03-26 MED ORDER — FENOFIBRATE 54 MG PO TABS
54.0000 mg | ORAL_TABLET | Freq: Every day | ORAL | Status: DC
  Filled 2024-03-26: qty 1

## 2024-03-26 MED ORDER — ATORVASTATIN CALCIUM 40 MG PO TABS
80.0000 mg | ORAL_TABLET | Freq: Every day | ORAL | Status: DC
  Administered 2024-03-26: 80 mg via ORAL
  Filled 2024-03-26: qty 2

## 2024-03-26 MED ORDER — SENNA 8.6 MG PO TABS
1.0000 | ORAL_TABLET | Freq: Two times a day (BID) | ORAL | Status: DC
  Filled 2024-03-26 (×2): qty 1

## 2024-03-26 MED ORDER — ALUM & MAG HYDROXIDE-SIMETH 200-200-20 MG/5ML PO SUSP: 30.0000 mL | Freq: Four times a day (QID) | ORAL | Status: DC | PRN

## 2024-03-26 MED ORDER — ACETAMINOPHEN 500 MG PO TABS
1000.0000 mg | ORAL_TABLET | Freq: Once | ORAL | Status: AC
  Administered 2024-03-26: 1000 mg via ORAL
  Filled 2024-03-26: qty 2

## 2024-03-26 MED ORDER — ASPIRIN 81 MG PO TBEC
81.0000 mg | DELAYED_RELEASE_TABLET | Freq: Every day | ORAL | Status: DC
  Administered 2024-03-26: 81 mg via ORAL
  Filled 2024-03-26: qty 1

## 2024-03-26 MED ORDER — PERFLUTREN LIPID MICROSPHERE
1.0000 mL | INTRAVENOUS | Status: AC | PRN
  Administered 2024-03-26: 3 mL via INTRAVENOUS

## 2024-03-26 MED ORDER — SODIUM CHLORIDE 0.9 % IV SOLN: INTRAVENOUS | Status: AC

## 2024-03-26 NOTE — Progress Notes (Signed)
 Echocardiogram 2D Echocardiogram has been performed.  Merlynn Argyle 03/26/2024, 2:22 PM

## 2024-03-26 NOTE — Consult Note (Addendum)
 "  Cardiology Consultation   Patient ID: [name] MRN: [id number]; DOB: 1961/01/08  Admit date: 03/26/2024 Date of Consult: 03/26/2024  PCP:  Duwayne Katheryn HERO, PA    HeartCare Providers Cardiologist:  None [facility]  Patient Profile: Heather Cruz is a 64 y.o. female with a hx of CAD with reported stent placement to RCA and D1 2018, SVT ablation, hypertension, prior cocaine use, nicotine dependence who is being seen 03/26/2024 for the evaluation of chest pain at the request of Dr. Raenelle.  History of Present Illness: Ms. Heather Cruz has history of CAD with stents placed to RCA and diagonal artery 10/2016 performed at [facility].  This was prompted by abnormal stress echocardiogram showing wall motion abnormalities.  History of was felt to be SVT ablation.  Recently establish cardiology care with [facility] for complaints of SOB with reportedly mildly elevated BNP.  Echocardiogram 02/17/2024 showed EF 60 to 65%, moderate diastolic dysfunction.  They thought uncontrolled blood pressure was culprit of symptoms and started hydrochlorothiazide 12.5 mg.  Currently patient being evaluated for an episode of hypotension, chest pain, shoulder pain.  Per EMS was significantly hypotensive with blood pressure 60/40.  Received IV fluids and with improvement in blood pressures.  Troponins have been negative x3.  proBNP 207.  D-dimer negative.  Chest x-ray without any acute findings, large hiatal hernia.  UDS negative.  Given fentanyl  for pain.  Mild AKI.  Patient reports that she has chronic palpitations with mild chest discomfort when her heart rates get fast since her SVT ablation.  She reported 1 episode of chest pain yesterday while laying in bed that she described as a squeezing sensation with concomitant shoulder pain although she does not say the pain necessarily radiated to her right shoulder.  Pain is not reproducible with movement or with palpation.  Reported prior similar symptoms prompted the left heart  catheterization and ischemic workup.  Reported no exertional aspect.  She had pain around 11:00 at night and that lasted until 3 or 4 AM but relieved completely with fentanyl .  Not currently having any chest pain.  Reports SOB also has completely resolved.  Reports this is sporadic and random at times without any pattern.  No peripheral edema, orthopnea.  Denies any drug use currently but admits to crack cocaine in July.  Also reports very poor hydration.   Past Medical History:  Diagnosis Date   Back pain    Depression    GERD (gastroesophageal reflux disease)    Hyperlipidemia    Hypertension    Migraine    Perennial allergic rhinitis    PSVT (paroxysmal supraventricular tachycardia)    Seasonal allergies    Sleep apnea    Substance abuse (HCC)    pain medication   Tachycardia     Past Surgical History:  Procedure Laterality Date   ABDOMINAL HYSTERECTOMY     CARDIAC CATHETERIZATION     and ablation   COLONOSCOPY N/A 06/15/2013   Procedure: COLONOSCOPY;  Surgeon: Lamar HERO Hollingshead, MD;  Location: [facility];  Service: Endoscopy;  Laterality: N/A;  11:15-moved to 1200 Leigh Ann to notify pt   ESOPHAGOGASTRODUODENOSCOPY N/A 06/15/2013   Procedure: ESOPHAGOGASTRODUODENOSCOPY (EGD);  Surgeon: Lamar HERO Hollingshead, MD;  Location: [facility];  Service: Endoscopy;  Laterality: N/A;   FOOT SURGERY     TUBAL LIGATION       Scheduled Meds:  aspirin  EC  81 mg Oral Daily   DULoxetine   90 mg Oral Daily   enoxaparin  (  LOVENOX ) injection  40 mg Subcutaneous Q24H   pantoprazole   40 mg Oral BID   senna  1 tablet Oral BID   Continuous Infusions:  sodium chloride  75 mL/hr at 03/26/24 1028   PRN Meds: acetaminophen  **OR** acetaminophen , albuterol , alum & mag hydroxide-simeth, bisacodyl , HYDROmorphone  (DILAUDID ) injection, ondansetron  (ZOFRAN ) IV, oxyCODONE , polyethylene glycol  Allergies:   Allergies[1]  Social History:   Social History   Socioeconomic History   Marital status: Divorced     Spouse name: Not on file   Number of children: Not on file   Years of education: Not on file   Highest education level: Not on file  Occupational History   Not on file  Tobacco Use   Smoking status: Every Day    Current packs/day: 1.00    Average packs/day: 1 pack/day for 30.0 years (30.0 ttl pk-yrs)    Types: Cigarettes   Smokeless tobacco: Not on file  Vaping Use   Vaping status: Never Used  Substance and Sexual Activity   Alcohol use: Yes    Comment: occasionally   Drug use: Yes    Comment: Occ Crack   Sexual activity: Not on file  Other Topics Concern   Not on file  Social History Narrative   Not on file   Social Drivers of Health   Tobacco Use: High Risk (03/26/2024)   Patient History    Smoking Tobacco Use: Every Day    Smokeless Tobacco Use: Unknown    Passive Exposure: Not on file  Financial Resource Strain: Low Risk (09/15/2021)   Received from [facility]   Overall Financial Resource Strain (CARDIA)    Difficulty of Paying Living Expenses: Not very hard  Food Insecurity: Low Risk (08/15/2023)   Received from [facility]   Epic    Within the past 12 months, you worried that your food would run out before you got money to buy more: Never true    Within the past 12 months, the food you bought just didn't last and you didn't have money to get more. : Never true  Transportation Needs: No Transportation Needs (08/15/2023)   Received from [facility]    In the past 12 months, has lack of reliable transportation kept you from medical appointments, meetings, work or from getting things needed for daily living? : No  Physical Activity: Inactive (09/15/2021)   Received from [facility] visits prior to 05/05/2022., [facility]   Exercise Vital Sign    On average, how many days per week do you engage in moderate to strenuous exercise (like a brisk walk)?: 0 days    On average, how many minutes do you engage in exercise at this  level?: 0 min  Stress: Stress Concern Present (09/15/2021)   Received from [facility] visits prior to 05/05/2022., [facility]   [employer] of Occupational Health - Occupational Stress Questionnaire    Feeling of Stress : Very much  Social Connections: Socially Isolated (09/15/2021)   [facility] visits prior to 05/05/2022., [facility]   Social Connection and Isolation Panel    In a typical week, how many times do you talk on the phone with family, friends, or neighbors?: Once a week    How often do you get together with friends or relatives?: Never    How often do you attend church or religious services?: Never    Do you belong to any clubs or  organizations such as church groups, unions, fraternal or athletic groups, or school groups?: No    How often do you attend meetings of the clubs or organizations you belong to?: Never    Are you married, widowed, divorced, separated, never married, or living with a partner?: Divorced  Intimate Partner Violence: Not on file  Depression (PHQ2-9): Not on file  Alcohol Screen: Not on file  Housing: Low Risk (08/15/2023)   Received from [facility]   Epic    What is your living situation today?: I have a steady place to live    Think about the place you live. Do you have problems with any of the following? Choose all that apply:: None/None on this list  Utilities: Low Risk (08/15/2023)   Received from [facility]   Utilities    In the past 12 months has the electric, gas, oil, or water  company threatened to shut off services in your home? : No  Health Literacy: Not on file    Family History:   Family History  Problem Relation Age of Onset   Hypertension Mother    Hypertension Father    Diabetes Father    Hypertension Brother    Hypertension Maternal Grandfather    Heart disease Maternal Grandfather    Heart disease Paternal Grandmother    Diabetes Paternal Grandfather     Stroke Paternal Grandfather    Colon cancer Neg Hx      ROS:  Please see the history of present illness.  All other ROS reviewed and negative.     Physical Exam/Data: Vitals:   03/26/24 0915 03/26/24 1000 03/26/24 1017 03/26/24 1030  BP: 132/69 124/71  117/79  Pulse: 85 85  86  Resp:  20  20  Temp:   97.8 F (36.6 C)   TempSrc:   Oral   SpO2: 100% 98%  99%  Weight:      Height:        Intake/Output Summary (Last 24 hours) at 03/26/2024 1148 Last data filed at 03/26/2024 1030 Gross per 24 hour  Intake 2404.41 ml  Output --  Net 2404.41 ml      03/26/2024    1:06 AM 02/03/2024    6:25 PM 02/03/2024    5:26 PM  Last 3 Weights  Weight (lbs) 124 lb 124 lb 124 lb  Weight (kg) 56.246 kg 56.246 kg 56.246 kg     Body mass index is 23.43 kg/m.  General:  Well nourished, well developed, in no acute distress HEENT: normal Neck: no JVD Vascular: No carotid bruits; Distal pulses 2+ bilaterally Cardiac:  normal S1, S2; RRR; 2/6 murmur LSB Lungs:  clear to auscultation bilaterally, no wheezing, rhonchi or rales  Abd: soft, nontender, no hepatomegaly  Ext: no edema Musculoskeletal:  No deformities, BUE and BLE strength normal and equal Skin: warm and dry  Neuro:  CNs 2-12 intact, no focal abnormalities noted Psych:  Normal affect   EKG:  The EKG was personally reviewed and demonstrates: Sinus rhythm, PVCs, heart rate 84.  QTc 511.  minimal ST depressions  Telemetry:  Telemetry was personally reviewed and demonstrates: Sinus rhythm, PACs/PVCs  Relevant CV Studies: Echo 02/17/2024 SUMMARY  The left ventricular size is normal.  There is mild concentric left ventricular hypertrophy.  LV ejection fraction = 60-65%.  The right ventricle is normal in size and function.  The left atrium is mildly dilated.  There is mild to moderate mitral regurgitation.  There is mild tricuspid regurgitation.  No  pulmonary hypertension.  G2 diastolic dysfunction with elevated left atrial  pressure.  There is no comparison study available.  -  FINDINGS  LEFT VENTRICLE  The left ventricular size is normal. There is mild concentric left ventricular hypertrophy. LV  ejection fraction = 60-65%. LV Global L Strain =-15.7%. Moderate diastolic dysfunction [pseudonormal  pattern] with elevated left atrial pressure.  -  RIGHT VENTRICLE  The right ventricle is normal in size and function.  LEFT ATRIUM  The left atrium is mildly dilated.  RIGHT ATRIUM  Right atrial size is normal.  -  AORTIC VALVE  Diffuse thickening of the aortic valve with preserved cusp opening. There is no aortic stenosis.  There is no aortic regurgitation.  -  MITRAL VALVE  The mitral valve leaflets appear thickened, but open well. There is mild to moderate mitral  regurgitation.  -  TRICUSPID VALVE  Tricuspid leaflets are thickened. There is mild tricuspid regurgitation. No pulmonary hypertension.  -  PULMONIC VALVE  The pulmonic valve is not well visualized. Mild pulmonic valvular regurgitation.  -  ARTERIES  The aortic sinus is normal size. The ascending aorta is normal size.  -  VENOUS  Pulmonary venous flow pattern is normal. Normal IVC size and collapsibility; Right atrial pressure  is estimated to be 3 mm Hg.  -  EFFUSION  There is no pericardial effusion.  -   Laboratory Data: High Sensitivity Troponin:  No results for input(s): TROPONINIHS in the last 720 hours.  Recent Labs  Lab 03/26/24 0127 03/26/24 0359 03/26/24 0753  TRNPT 6 <6 <6      Chemistry Recent Labs  Lab 03/26/24 0127 03/26/24 0359  NA 136  --   K 3.5  --   CL 100  --   CO2 21*  --   GLUCOSE 154*  --   BUN 25*  --   CREATININE 1.21*  --   CALCIUM  8.7*  --   MG  --  1.8  GFRNONAA 50*  --   ANIONGAP 15  --     Recent Labs  Lab 03/26/24 0127  PROT 6.5  ALBUMIN 4.0  AST 22  ALT 18  ALKPHOS 61  BILITOT 0.2   Lipids No results for input(s): CHOL, TRIG, HDL, LABVLDL, LDLCALC, CHOLHDL in  the last 168 hours.  Hematology Recent Labs  Lab 03/26/24 0127  WBC 8.7  RBC 4.06  HGB 12.0  HCT 37.3  MCV 91.9  MCH 29.6  MCHC 32.2  RDW 13.7  PLT 328   Thyroid No results for input(s): TSH, FREET4 in the last 168 hours.  BNP Recent Labs  Lab 03/26/24 0359  PROBNP 207.0    DDimer  Recent Labs  Lab 03/26/24 0753  DDIMER 0.33    Radiology/Studies:  DG Chest Left Decubitus Result Date: 03/26/2024 EXAM: 1 LEFT LATERAL DECUBITUS VIEW XRAY OF THE CHEST 03/26/2024 06:36:46 AM COMPARISON: Portable chest film from 03/26/2024, portable chest film from 02/14/2024, and portable chest film from 12/28/2016. CLINICAL HISTORY: Abnormal CXR. Abnormal chest X-ray. FINDINGS: LUNGS AND PLEURA: Dependent atelectasis. No pleural effusion. No pneumothorax. HEART AND MEDIASTINUM: The cardiac size is normal. A large hiatal hernia is again noted. No acute abnormality of the mediastinal silhouette. BONES AND SOFT TISSUES: No acute osseous abnormality. Single left lateral decubitus view demonstrates no evidence of free intraperitoneal air. There is scalloping of the right diaphragmatic surface due to multiple eventrations, which could mimic a free air crescent AP. IMPRESSION: 1. No evidence of free intraperitoneal  air. 2. Multiple eventrations of the right hemidiaphragm, which very likely accounts for the reported lucency. The appearance of today's portable chest is actually very similar to that seen on 12/28/2016. 3. Large hiatal hernia. Electronically signed by: Francis Quam MD 03/26/2024 06:48 AM EST RP Workstation: [id number]   DG Chest Port 1 View Result Date: 03/26/2024 CLINICAL DATA:  Chest pain EXAM: PORTABLE CHEST 1 VIEW COMPARISON:  02/03/2024 FINDINGS: Large hiatal hernia. No acute airspace disease or effusion. Normal cardiac size with aortic atherosclerosis. Lucent area beneath right diaphragm probably soft tissue artifact IMPRESSION: 1. Large hiatal hernia 2. Lucent area beneath the right  diaphragm, suspect soft tissue artifact, but consider decubitus view for further assessment Electronically Signed   By: Luke Bun M.D.   On: 03/26/2024 01:57     Assessment and Plan:  Chest pain CAD -10/2016 stent placement to RCA/D1 She reports acute onset of chest pain while laying in bed yesterday with pain in her right shoulder.  Symptoms similar to prior stents.  Reported episode lasting from 11 to 4 AM, relieved with fentanyl .  Not currently having any chest pain.  EKG with minimal ST depression, stable on serial EKGs. Troponins have been negative x 3 despite 5 hours of pain, therefore ACS ruled out. Continue with aspirin  and atorvastatin  80 mg. Will get updated lipid panel.  MD will decide on outpatient ischemic evaluation vs heart cath.  Echo pending this admission.  VHD There is mild to moderate mitral regurgitation. There is mild tricuspid regurgitation. Repeat echo annual.  SVT ablation Still reports some palpitations.  No significant episodes on telemetry.  PACs noted.  Could consider beta-blocker therapy once blood pressures become more stable.  Nicotine dependence/cocaine use Reports 1 pack/day since she was a teenager.  Last cocaine use in July.  Encourage cessation.  Hypotension/presyncope In the setting of poor p.o. intake, new initiation of HCTZ and AKI on admission secondary to dehydration.  Blood pressure has improved after IV fluids.  Would not continue HCTZ at discharge.  SOB Resolved. 02/2024 echocardiogram EF 60-65%, GLS -15.7.  G2DD Euvolemic on exam.  Reports sporadic symptoms. Normal pro-bnp argues against true CHF.  Prolonged QTc QTc 511 repeat EKG for tomorrow.  Avoid prolonging medications.  Of note she is established with [facility] and wants to follow-up with them at discharge.  Risk Assessment/Risk Scores:   For questions or updates, please contact [facility] Please consult [email or web address] for contact info under     Signed, Thom LITTIE Sluder, PA-C  03/26/2024 11:48 AM       [1]  Allergies Allergen Reactions   Ciprofloxacin Nausea Only   Doxycycline  Nausea Only   Penicillins Hives    Can take and tolerate cephalosporins   Sulfa Antibiotics Nausea Only   "

## 2024-03-26 NOTE — ED Provider Notes (Signed)
 " [place] EMERGENCY DEPARTMENT AT [facility] Provider Note   CSN: [id number] Arrival date & time: 03/26/24  9943     Patient presents with: Near Syncope, Chest Pain, and Hypotension   Heather Cruz is a 64 y.o. female.   64 yo female brought in by EMS from home. Patient states she developed pain in her right shoulder and felt dizzy around 11pm tonight while lying in bed. Patient went to the bathroom, felt very dizzy when she tried to stand up from the commode, went to the sofa and called 911. EMS arrived and found BP to be 64/40. Patient reported mild chest tightness and was given 243mg  ASA. States the right shoulder pain and dizziness feels like when she had a blockage in 2018 and had 2 stents placed. Then had right flank pain which was more similar to her prior kidney stones. Nausea without vomiting, no abdominal pain.   Added lasix daily on 03/19/24 for intermittent SHOB. Took her BP meds at 8pm.        Prior to Admission medications  Medication Sig Start Date End Date Taking? Authorizing Provider  acetaminophen  (TYLENOL ) 325 MG tablet Take 1,000 mg by mouth 2 (two) times daily.    [provider]  ALPRAZolam  (XANAX ) 1 MG tablet Take 1 mg by mouth 3 (three) times daily as needed for anxiety.    [provider]  diphenhydrAMINE  (BENADRYL ) 25 MG tablet Take 50 mg by mouth 2 (two) times daily.    [provider]  DULoxetine  (CYMBALTA ) 60 MG capsule TAKE ONE CAPSULE BY MOUTH ONCE DAILY 12/15/15   Hoskins, Carolyn C, NP  HYDROcodone -acetaminophen  (NORCO/VICODIN) 5-325 MG tablet Take 1 tablet by mouth every 6 (six) hours as needed for moderate pain.    [provider]  lisinopril  (PRINIVIL ,ZESTRIL ) 5 MG tablet Take 1 tablet (5 mg total) by mouth daily. 01/14/15   Alphonsa Elsie RAMAN, MD  pantoprazole  (PROTONIX ) 40 MG tablet TAKE ONE TABLET BY MOUTH ONCE DAILY 11/23/16   Alphonsa Elsie RAMAN, MD  propranolol  (INDERAL ) 40 MG tablet TAKE ONE TABLET BY  MOUTH TWICE DAILY 12/15/15   Hoskins, Carolyn C, NP  tamsulosin  (FLOMAX ) 0.4 MG CAPS capsule Take 1 capsule (0.4 mg total) by mouth daily. 02/04/24   Arlee Katz, MD    Allergies: Ciprofloxacin, Doxycycline , Penicillins, and Sulfa antibiotics    Review of Systems Negative except as per HPI Updated Vital Signs BP (!) 90/54   Pulse 91   Temp 98.2 F (36.8 C) (Temporal)   Resp (!) 25   Ht 5' 1 (1.549 m)   Wt 56.2 kg   SpO2 97%   BMI 23.43 kg/m   Physical Exam Vitals and nursing note reviewed.  Constitutional:      General: She is not in acute distress.    Appearance: She is well-developed. She is not diaphoretic.  HENT:     Head: Normocephalic and atraumatic.  Cardiovascular:     Rate and Rhythm: Normal rate and regular rhythm.     Heart sounds: Normal heart sounds.  Pulmonary:     Effort: Pulmonary effort is normal.     Breath sounds: Normal breath sounds.  Chest:     Chest wall: No tenderness.  Abdominal:     Palpations: Abdomen is soft.     Tenderness: There is no abdominal tenderness.  Musculoskeletal:     Cervical back: Neck supple.       Back:     Right lower leg: No  tenderness. No edema.     Left lower leg: No tenderness. No edema.  Skin:    General: Skin is warm and dry.     Findings: No ecchymosis, erythema or rash.  Neurological:     Mental Status: She is alert and oriented to person, place, and time.  Psychiatric:        Behavior: Behavior normal.     (all labs ordered are listed, but only abnormal results are displayed) Labs Reviewed  COMPREHENSIVE METABOLIC PANEL WITH GFR - Abnormal; Notable for the following components:      Result Value   CO2 21 (*)    Glucose, Bld 154 (*)    BUN 25 (*)    Creatinine, Ser 1.21 (*)    Calcium  8.7 (*)    GFR, Estimated 50 (*)    All other components within normal limits  URINALYSIS, ROUTINE W REFLEX MICROSCOPIC - Abnormal; Notable for the following components:   Color, Urine STRAW (*)    Hgb urine  dipstick SMALL (*)    Leukocytes,Ua TRACE (*)    All other components within normal limits  CBC  URINE DRUG SCREEN  TROPONIN T, HIGH SENSITIVITY  TROPONIN T, HIGH SENSITIVITY    EKG: EKG Interpretation Date/Time:  Thursday March 26 2024 01:56:13 EST Ventricular Rate:  84 PR Interval:  153 QRS Duration:  94 QT Interval:  432 QTC Calculation: 511 R Axis:   24  Text Interpretation: Sinus rhythm Ventricular bigeminy Probable left atrial enlargement Abnormal R-wave progression, early transition Minimal ST depression, diffuse leads Prolonged QT interval Confirmed by Haze Lonni PARAS ([id number]) on 03/26/2024 2:32:45 AM  Radiology: [facility] Chest Port 1 View Result Date: 03/26/2024 CLINICAL DATA:  Chest pain EXAM: PORTABLE CHEST 1 VIEW COMPARISON:  02/03/2024 FINDINGS: Large hiatal hernia. No acute airspace disease or effusion. Normal cardiac size with aortic atherosclerosis. Lucent area beneath right diaphragm probably soft tissue artifact IMPRESSION: 1. Large hiatal hernia 2. Lucent area beneath the right diaphragm, suspect soft tissue artifact, but consider decubitus view for further assessment Electronically Signed   By: Luke Bun M.D.   On: 03/26/2024 01:57     .Critical Care  Performed by: Beverley Leita LABOR, PA-C Authorized by: Beverley Leita LABOR, PA-C   Critical care provider statement:    Critical care time (minutes):  30   Critical care was time spent personally by me on the following activities:  Development of treatment plan with patient or surrogate, discussions with consultants, evaluation of patient's response to treatment, examination of patient, ordering and review of laboratory studies, ordering and review of radiographic studies, ordering and performing treatments and interventions, pulse oximetry, re-evaluation of patient's condition and review of old charts    Medications Ordered in the ED  sodium chloride  0.9 % bolus 1,000 mL (0 mLs Intravenous Stopped 03/26/24 0304)   fentaNYL  (SUBLIMAZE ) injection 50 mcg (50 mcg Intravenous Given 03/26/24 0236)    Clinical Course as of 03/26/24 0610  Thu Mar 26, 2024  0143 BP currently 95 systolic.  [LM]  0516 BP 98/46 currently, patient sleeping [LM]    Clinical Course User Index [LM] Beverley Leita LABOR, PA-C                                 Medical Decision Making Amount and/or Complexity of Data Reviewed Labs: ordered. Radiology: ordered.   This patient presents to the ED for concern of dizziness, CP, this involves  an extensive number of treatment options, and is a complaint that carries with it a high risk of complications and morbidity.  The differential diagnosis includes but not limited to ACS, AKI, electrolyte/metabolic   Co morbidities / Chronic conditions that complicate the patient evaluation  HTN, PSVT, GERD, HLD, OSA, substance abuse    Additional history obtained:  Additional history obtained from EMR External records from outside source obtained and reviewed including prior labs on file    Lab Tests:  I Ordered, and personally interpreted labs.  The pertinent results include:  UA with small HGN, trace leukocytes. CBC WNL. CMP with Cr 1.21, increased from 0.8 1 month ago. Troponin 6, repeat <6. UDS negative    Imaging Studies ordered:  I ordered imaging studies including CXR  I independently visualized and interpreted imaging which showed nonspecific lucency over right hemidiaphragm I agree with the radiologist interpretation- add on decubitus view    Cardiac Monitoring: / EKG:  The patient was maintained on a cardiac monitor.  I personally viewed and interpreted the cardiac monitored which showed an underlying rhythm of: sinus rhythm, rate 84, non specific ST changes.    Problem List / ED Course / Critical interventions / Medication management  64 yo female presents from home via EMS for right shoulder pain/dizziness that progressed to chest tightness and dizziness with standing,  found to be hypotensive on EMS arrival, somewhat improved with 500cc NS with EMS enroute to the ER with with arrival BP of 105/81. BP variable while in the ER. Provided with additional 1L NS. BP 80s with standing after IV fluids. Worsening CP in the ER, trops negative (6, <6), with with non specific changes. Provided with fentanyl  (unable to give nitroglycerine due to soft Bps) with improvement. Slight AKI with Cr 1.21 up from 0.8, possibly due to starting lasix 1 week ago. CBC WNL.  I have reviewed the patients home medicines and have made adjustments as needed   Consultations Obtained:  I requested consultation with the hospitalist, Dr. Marcene,  and discussed lab and imaging findings as well as pertinent plan - they recommend: will consult for admission   Social Determinants of Health:  PCP out of network    Test / Admission - Considered:  admit       Final diagnoses:  Hypotension, unspecified hypotension type  Chest pain, unspecified type  AKI (acute kidney injury)    ED Discharge Orders     None          Beverley Leita LABOR, PA-C 03/26/24 0610    Haze Lonni PARAS, MD 03/26/24 [phone]  "

## 2024-03-26 NOTE — Progress Notes (Signed)
" °  Carryover admission to the Day Admitter.  I discussed this case with the EDP, Leita Chancy, PA.  Per these discussions:   This is a 63 year old female with history of coronary artery disease status post PCI with stents placed in 2018, who is being admitted for further evaluation of new onset of left-sided chest tightness that started around 11 PM on 03/25/2024.  The patient conveys that she took her evening medications around 8 PM, which included multiple antihypertensive medications.  She also notes that she was started on Lasix last week after reporting some shortness of breath.  Chest pain is subsequently resolved, the patient is now resting comfortably.  Vital signs in the ED were notable for initial hypotension, which is improved following 1 L IV fluid bolus, with EDP conveying that systolic blood pressures are now in the 90s, with maps greater than 65.  High sensitive troponin T was initially 6, with repeat trending down to less than 6.  EKGs reported showed no evidence of acute ischemic changes, including no evidence of STEMI.  Chest x-ray showed no evidence of acute cardiopulmonary process.  I have placed an order for observation for further evaluation management of the above.  I have placed some additional preliminary admit orders via the adult multi-morbid admission order set. I have also ordered third troponin to be checked this morning, in addition to echocardiogram.  Have also placed orders for add-on proBNP as well as add on D-dimer.  I have also ordered an additional 1 L lactated ringer  bolus.    Eva Pore, DO Hospitalist  "

## 2024-03-26 NOTE — ED Notes (Signed)
 CCMD contacted to put patient on cardiac monitoring.

## 2024-03-26 NOTE — ED Triage Notes (Signed)
 Pt BIB [facility] from home. EMS reports that for the past hour she felt dizzy & light headed. Pt also endorses 7/10 R sided arm pain. Initially hypotensive at 64/40BP. 243 ASA from EMS, 81 ASA prior to EMS arrival. Hx MI, kidney stones  EMS 22 LHand 350 NS 95% RA ST

## 2024-03-26 NOTE — H&P (Signed)
 " Virtual History and Physical   Referring Provider: Lonni Seats Telemedicine Provider: Donalda Applebaum MD Provider Location: [facility] Patient Location: [facility] ED Referring Diagnosis: Chest Pain Patient Name and DOB verified: yes Patient consented to Telemedicine Evaluation:yes RN virtual assistant: Nidia Albee Video encounter time and date:03/26/24 at 9:05 am   Patient: Heather Cruz FMW:[id number] DOB: [birth year] PCP: Duwayne Katheryn HERO, PA    Chief Complaint:  Chief Complaint  Patient presents with   Near Syncope   Chest Pain   Hypotension   HPI: Heather Cruz is a 64 y.o. female with medical history significant of CAD s/p PCI 2018, HTN, PSVT-s/p radiofrequency ablation, HLD who presented to the ED with left-sided chest tightness/lightheadedness/presyncope/right shoulder shoulder pain.   Per patient-she was in her usual state of health-yesterday afternoon she felt yucky-felt nauseous and felt that her left flank was starting to hurt-she thought that she was having a kidney stone-however her left flank pain spontaneously abated.  Around 87 PM-when she was lying down-she then started having left-sided chest pain that she describes as pressure-like-6/10 in intensity-radiating to her right shoulder-her neck.  She also at this time started having shortness of breath-and some spells of diaphoresis (she attributed this to menopause).  She subsequently called 911-however she proceeded to walk to the bathroom-and subsequently got dizzy/lightheaded and sat down on the floor.  EMS subsequently arrived-she was initially hypotensive for EMS with blood pressure 64/40-she was given aspirin -some IV fluids-and subsequently brought to the ED.  Per patient-she recently saw her cardiologist-she was getting some exertional dyspnea-in he recently put her on HCTZ.  She also had a echocardiogram that showed normal EF.  Per patient-her current symptoms of chest pain/right shoulder pain is very similar  to her prior angina/MI that she had in 2018 requiring percutaneous intervention.  She was subsequently evaluated in the ED-initial troponins were negative-she had mild AKI-blood pressure stabilized with IV fluids-the hospitalist service was then asked to admit this patient for further evaluation and treatment.    Review of Systems: As mentioned in the history of present illness. All other systems reviewed and are negative. Past Medical History:  Diagnosis Date   Back pain    Depression    GERD (gastroesophageal reflux disease)    Hyperlipidemia    Hypertension    Migraine    Perennial allergic rhinitis    PSVT (paroxysmal supraventricular tachycardia)    Seasonal allergies    Sleep apnea    Substance abuse (HCC)    pain medication   Tachycardia    Past Surgical History:  Procedure Laterality Date   ABDOMINAL HYSTERECTOMY     CARDIAC CATHETERIZATION     and ablation   COLONOSCOPY N/A 06/15/2013   Procedure: COLONOSCOPY;  Surgeon: Lamar HERO Hollingshead, MD;  Location: [facility];  Service: Endoscopy;  Laterality: N/A;  11:15-moved to 1200 Leigh Ann to notify pt   ESOPHAGOGASTRODUODENOSCOPY N/A 06/15/2013   Procedure: ESOPHAGOGASTRODUODENOSCOPY (EGD);  Surgeon: Lamar HERO Hollingshead, MD;  Location: [facility];  Service: Endoscopy;  Laterality: N/A;   FOOT SURGERY     TUBAL LIGATION     Social History:  reports that she has been smoking cigarettes. She has a 30 pack-year smoking history. She does not have any smokeless tobacco history on file. She reports current alcohol use. She reports current drug use.  Allergies[1]  Family History  Problem Relation Age of Onset   Hypertension Mother    Hypertension Father    Diabetes Father  Hypertension Brother    Hypertension Maternal Grandfather    Heart disease Maternal Grandfather    Heart disease Paternal Grandmother    Diabetes Paternal Grandfather    Stroke Paternal Grandfather    Colon cancer Neg Hx     Prior to Admission  medications  Medication Sig Start Date End Date Taking? Authorizing Provider  acetaminophen  (TYLENOL ) 325 MG tablet Take 1,000 mg by mouth 2 (two) times daily.    [provider]  ALPRAZolam  (XANAX ) 1 MG tablet Take 1 mg by mouth 3 (three) times daily as needed for anxiety.    [provider]  diphenhydrAMINE  (BENADRYL ) 25 MG tablet Take 50 mg by mouth 2 (two) times daily.    [provider]  DULoxetine  (CYMBALTA ) 60 MG capsule TAKE ONE CAPSULE BY MOUTH ONCE DAILY 12/15/15   Hoskins, Carolyn C, NP  HYDROcodone -acetaminophen  (NORCO/VICODIN) 5-325 MG tablet Take 1 tablet by mouth every 6 (six) hours as needed for moderate pain.    [provider]  lisinopril  (PRINIVIL ,ZESTRIL ) 5 MG tablet Take 1 tablet (5 mg total) by mouth daily. 01/14/15   Alphonsa Elsie RAMAN, MD  pantoprazole  (PROTONIX ) 40 MG tablet TAKE ONE TABLET BY MOUTH ONCE DAILY 11/23/16   Alphonsa Elsie RAMAN, MD  propranolol  (INDERAL ) 40 MG tablet TAKE ONE TABLET BY MOUTH TWICE DAILY 12/15/15   Hoskins, Carolyn C, NP  tamsulosin  (FLOMAX ) 0.4 MG CAPS capsule Take 1 capsule (0.4 mg total) by mouth daily. 02/04/24   Arlee Katz, MD    Physical Exam: Done at bedside by [facility] this encounter. Vitals:   03/26/24 0415 03/26/24 0500 03/26/24 0504 03/26/24 0530  BP: (!) 89/69 93/65  (!) 90/54  Pulse: 87 85  91  Resp: 18 16  (!) 25  Temp:   98.2 F (36.8 C)   TempSrc:   Temporal   SpO2: 93% 93%  97%  Weight:      Height:       Gen Exam:Alert awake-not in any distress HEENT:atraumatic, normocephalic Chest: B/L clear to auscultation anteriorly-chest pain is nonreproducible. CVS:S1S2 regular Abdomen:soft non tender, non distended Extremities:no edema Neurology: Non focal Skin: no rash  Data Reviewed:     Latest Ref Rng & Units 03/26/2024    1:27 AM 02/03/2024    6:50 PM 08/28/2022   11:01 PM  CBC  WBC 4.0 - 10.5 K/uL 8.7  7.6  7.9   Hemoglobin 12.0 - 15.0 g/dL 87.9  88.3   89.9   Hematocrit 36.0 - 46.0 % 37.3  36.2  31.0   Platelets 150 - 400 K/uL 328  282  384      BMET    Component Value Date/Time   NA 136 03/26/2024 0127   K 3.5 03/26/2024 0127   CL 100 03/26/2024 0127   CO2 21 (L) 03/26/2024 0127   GLUCOSE 154 (H) 03/26/2024 0127   BUN 25 (H) 03/26/2024 0127   CREATININE 1.21 (H) 03/26/2024 0127   CREATININE 0.49 (L) 05/12/2013 0831   CALCIUM  8.7 (L) 03/26/2024 0127   GFRNONAA 50 (L) 03/26/2024 0127    LID:Wzhjupcz  Trop x 2:neg  Chest x-ray: Hiatal hernia  Twelve-lead EKG: Normal sinus rhythm  Assessment and Plan: Left-sided chest pain With associated radiation to right shoulder area-some shortness of breath-and dizziness/presyncope Per patient-she had similar symptoms when she first had PCI in 2018 Currently chest pain-free after supportive care in the ED Recent echocardiogram at [facility] on 12/15 showed preserved EF Given her underlying cardiac issues-I  will get cardiology opinion  Presyncope Likely due to hypotension Occurred after she had chest pain-when she went to the bathroom-she never lost consciousness Recent echocardiogram stable Per EMS-hypotensive on initial presentation-recently her primary cardiologist has added a diuretic to her regimen. Blood pressure has stabilized-after IV fluids. Continue telemetry monitoring-await cardiology opinion.  Mild AKI Probably hemodynamically mediated in the setting of transient hypotension Supportive care Recheck electrolytes in the morning.  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 UDS negative this admission.   Advance Care Planning:   Code Status: Full Code    Consults: Cardiology  Family Communication: None at bedside  Severity of Illness: The appropriate patient status for this patient is INPATIENT. Inpatient status is judged to be reasonable and necessary in order to provide the required intensity of service to ensure the patient's safety. The patient's presenting symptoms, physical exam findings, and initial radiographic and laboratory data in the context of their chronic comorbidities is felt to place them at high risk for further clinical deterioration. Furthermore, it is not anticipated that the patient will be medically stable for discharge from the hospital within 2 midnights of admission.   * I certify that at the point of admission it is my clinical judgment that the patient will require inpatient hospital care spanning beyond 2 midnights from the point of admission due to high intensity of service, high risk for further deterioration and high frequency of surveillance required.*  Author: Donalda Applebaum, MD 03/26/2024 7:08 AM  For on call review [email or web address].      [1]  Allergies Allergen Reactions   Ciprofloxacin Nausea Only   Doxycycline  Nausea Only   Penicillins Hives    Can take and tolerate cephalosporins   Sulfa Antibiotics Nausea Only   "

## 2024-03-27 DIAGNOSIS — R079 Chest pain, unspecified: Secondary | ICD-10-CM | POA: Diagnosis not present

## 2024-03-27 DIAGNOSIS — N179 Acute kidney failure, unspecified: Secondary | ICD-10-CM | POA: Diagnosis not present

## 2024-03-27 LAB — BASIC METABOLIC PANEL WITH GFR
Anion gap: 9 (ref 5–15)
BUN: 16 mg/dL (ref 8–23)
CO2: 24 mmol/L (ref 22–32)
Calcium: 8.8 mg/dL — ABNORMAL LOW (ref 8.9–10.3)
Chloride: 108 mmol/L (ref 98–111)
Creatinine, Ser: 0.78 mg/dL (ref 0.44–1.00)
GFR, Estimated: >60 mL/min
Glucose, Bld: 95 mg/dL (ref 70–99)
Potassium: 3.7 mmol/L (ref 3.5–5.1)
Sodium: 142 mmol/L (ref 135–145)

## 2024-03-27 LAB — CBC
HCT: 34.5 % — ABNORMAL LOW (ref 36.0–46.0)
Hemoglobin: 11.5 g/dL — ABNORMAL LOW (ref 12.0–15.0)
MCH: 29.7 pg (ref 26.0–34.0)
MCHC: 33.3 g/dL (ref 30.0–36.0)
MCV: 89.1 fL (ref 80.0–100.0)
Platelets: 281 K/uL (ref 150–400)
RBC: 3.87 MIL/uL (ref 3.87–5.11)
RDW: 14 % (ref 11.5–15.5)
WBC: 6 K/uL (ref 4.0–10.5)
nRBC: 0 % (ref 0.0–0.2)

## 2024-03-27 MED ORDER — NITROGLYCERIN 0.4 MG SL SUBL: 0.4000 mg | SUBLINGUAL_TABLET | SUBLINGUAL | 3 refills | Status: AC | PRN

## 2024-03-27 NOTE — Progress Notes (Signed)
 Transition of Care [facility]) - Inpatient Brief Assessment   Patient Details  Name: Heather Cruz MRN: [id number] Date of Birth: 1960/10/01  [facility] CM/SW Contact:    Debarah Saunas, RN Phone Number: 03/27/2024, 7:23 AM   Clinical Narrative: Inpatient Care Management (ICM) has reviewed patient at bedside and no ICM needs have been identified at this time. We will continue to monitor patient advancement through interdisciplinary progression rounds. If new patient transition needs arise, please place a ICM consult.    Transition of Care Asessment: Insurance and Status: (P) Insurance coverage has been reviewed Patient has primary care physician: (P) Yes (Beane, Katheryn HERO, PA) Home environment has been reviewed: (P) home (2 story) with family (helps take care of parents) Prior level of function:: (P) independent   Social Drivers of Health Review: (P) SDOH reviewed no interventions necessary Readmission risk has been reviewed: (P) Yes Transition of care needs: (P) no transition of care needs at this time

## 2024-03-27 NOTE — Discharge Summary (Addendum)
 "     Physician Discharge Summary  Heather Cruz FMW:[id number] DOB: 10-20-1960 DOA: 03/26/2024  PCP: Duwayne Katheryn HERO, PA  Admit date: 03/26/2024 Discharge date: 03/27/2024  Admitted From:  Discharge disposition: Home   Recommendations for Outpatient Follow-Up:   Patient to follow-up with cardiology outpatient   Discharge Diagnosis:   Principal Problem:   Chest pain Active Problems:   Coronary artery disease    Discharge Condition: Improved.  Diet recommendation: Low sodium, heart healthy.  Carbohydrate-modified.  Regular.  Wound care: None.  Code status: Full.   History of Present Illness:   Heather Cruz is a 64 y.o. female with medical history significant of CAD s/p PCI 2018, HTN, PSVT-s/p radiofrequency ablation, HLD who presented to the ED with left-sided chest tightness/lightheadedness/presyncope/right shoulder shoulder pain.     Per patient-she was in her usual state of health-yesterday afternoon she felt yucky-felt nauseous and felt that her left flank was starting to hurt-she thought that she was having a kidney stone-however her left flank pain spontaneously abated.  Around 8 PM-when she was lying down-she then started having left-sided chest pain that she describes as pressure-like-6/10 in intensity-radiating to her right shoulder-her neck.  She also at this time started having shortness of breath-and some spells of diaphoresis (she attributed this to menopause).  She subsequently called 911-however she proceeded to walk to the bathroom-and subsequently got dizzy/lightheaded and sat down on the floor.   EMS subsequently arrived-she was initially hypotensive for EMS with blood pressure 64/40-she was given aspirin -some IV fluids-and subsequently brought to the ED.   Per patient-she recently saw her cardiologist-she was getting some exertional dyspnea-in he recently put her on HCTZ.  She also had a echocardiogram that showed normal EF.   Per patient-her current  symptoms of chest pain/right shoulder pain is very similar to her prior angina/MI that she had in 2018 requiring percutaneous intervention.   She was subsequently evaluated in the ED-initial troponins were negative-she had mild AKI-blood pressure stabilized with IV fluids-the hospitalist service was then asked to admit this patient for further evaluation and treatment.     Hospital Course by Problem:   Cardiology consult: The patient presents with an episode of chest pain that started last night at rest with associated shortness of breath and nausea.  The patient's HEART score puts her at an intermediate risk for obstructive CAD.  She has been chest pain-free now for over 12 hours and denies having had recent prior episodes of chest pain prior to this episode.  Her troponins are negative x3 and her EKG is nonischemic appearing.  Given that the patient has known CAD and that her chest pain was reminiscent of her prior cardiac chest pain, I do think that the patient warrants an ischemic evaluation; however, it does not have to be inpatient since the patient is no longer experiencing symptoms, is hemodynamically stable and is not having ACS.  I reviewed the patient's echocardiogram and she has preserved LV systolic function without wall motion abnormalities and stable/noncontributory valvular disease.  I gave the patient 2 options: 1) have close follow-up with her primary cardiologist for outpatient stress testing with a low threshold to return to the ED if chest pain recurs OR 2) be admitted and undergo a left heart catheterization tomorrow.  Ultimately the patient preferred option #1 which I also think is completely reasonable.  I recommend discharging the patient with as needed sublingual nitroglycerin  and I gave her return precautions for chest pain.  The  patient will call her primary cardiologist tomorrow to schedule a closer follow-up appointment.  The patient is in agreement and verbalized understanding.     AKI -resolved   Discharge Exam:   Vitals:   03/27/24 0529 03/27/24 0530  BP:  (!) 154/95  Pulse:  91  Resp:  (!) 24  Temp: 97.9 F (36.6 C)   SpO2:  100%   Vitals:   03/27/24 0000 03/27/24 0200 03/27/24 0529 03/27/24 0530  BP: (!) 146/114 (!) 167/85  (!) 154/95  Pulse: 86 84  91  Resp: (!) 21 12  (!) 24  Temp:   97.9 F (36.6 C)   TempSrc:   Oral   SpO2: 100% 97%  100%  Weight:      Height:        General exam: Appears calm and comfortable.    The results of significant diagnostics from this hospitalization (including imaging, microbiology, ancillary and laboratory) are listed below for reference.     Procedures and Diagnostic Studies:   ECHOCARDIOGRAM COMPLETE Result Date: 03/26/2024    ECHOCARDIOGRAM REPORT   Patient Name:   Heather Cruz Date of Exam: 03/26/2024 Medical Rec #:  [id number]    Height:       61.0 in Accession #:    [id number]   Weight:       124.0 lb Date of Birth:  [birth year]    BSA:          1.541 m Patient Age:    63 years     BP:           152/138 mmHg Patient Gender: F            HR:           89 bpm. Exam Location:  Inpatient Procedure: 2D Echo, Cardiac Doppler, Color Doppler and Intracardiac            Opacification Agent (Both Spectral and Color Flow Doppler were            utilized during procedure). Indications:    Chest Pain R07.9  History:        Patient has no prior history of Echocardiogram examinations.                 Risk Factors:Hypertension, Dyslipidemia and Current Smoker.  Sonographer:    Merlynn Argyle Referring Phys: [id number] JUSTIN B HOWERTER IMPRESSIONS  1. Left ventricular ejection fraction, by estimation, is 60 to 65%. The left ventricle has normal function. The left ventricle has no regional wall motion abnormalities. There is mild left ventricular hypertrophy. Left ventricular diastolic parameters were normal.  2. Right ventricular systolic function is normal. The right ventricular size is normal. There is mildly elevated pulmonary  artery systolic pressure. The estimated right ventricular systolic pressure is 40.0 mmHg.  3. The mitral valve is normal in structure. Mild to moderate mitral valve regurgitation. No evidence of mitral stenosis.  4. Tricuspid valve regurgitation is mild to moderate.  5. The aortic valve is grossly normal. Unable to determine aortic valve morphology due to image quality. Aortic valve regurgitation is not visualized. Aortic valve sclerosis is present, with no evidence of aortic valve stenosis.  6. The inferior vena cava is normal in size with greater than 50% respiratory variability, suggesting right atrial pressure of 3 mmHg. Comparison(s): No prior Echocardiogram. FINDINGS  Left Ventricle: Left ventricular ejection fraction, by estimation, is 60 to 65%. The left ventricle has normal function. The left ventricle has no regional  wall motion abnormalities. Definity  contrast agent was given IV to delineate the left ventricular  endocardial borders. The left ventricular internal cavity size was normal in size. There is mild left ventricular hypertrophy. Left ventricular diastolic parameters were normal. Right Ventricle: The right ventricular size is normal. No increase in right ventricular wall thickness. Right ventricular systolic function is normal. There is mildly elevated pulmonary artery systolic pressure. The tricuspid regurgitant velocity is 3.04  m/s, and with an assumed right atrial pressure of 3 mmHg, the estimated right ventricular systolic pressure is 40.0 mmHg. Left Atrium: Left atrial size was normal in size. Right Atrium: Right atrial size was normal in size. Pericardium: There is no evidence of pericardial effusion. Mitral Valve: The mitral valve is normal in structure. Mild to moderate mitral valve regurgitation. No evidence of mitral valve stenosis. Tricuspid Valve: The tricuspid valve is grossly normal. Tricuspid valve regurgitation is mild to moderate. No evidence of tricuspid stenosis. Aortic Valve:  The aortic valve is grossly normal. Aortic valve regurgitation is not visualized. Aortic valve sclerosis is present, with no evidence of aortic valve stenosis. Pulmonic Valve: The pulmonic valve was grossly normal. Pulmonic valve regurgitation is not visualized. Aorta: The aortic root and ascending aorta are structurally normal, with no evidence of dilitation. Venous: The inferior vena cava is normal in size with greater than 50% respiratory variability, suggesting right atrial pressure of 3 mmHg. IAS/Shunts: The atrial septum is grossly normal.  LEFT VENTRICLE PLAX 2D LVIDd:         4.20 cm   Diastology LVIDs:         2.70 cm   LV e' medial:    7.50 cm/s LV PW:         1.10 cm   LV E/e' medial:  12.9 LV IVS:        1.10 cm   LV e' lateral:   8.00 cm/s LVOT diam:     1.90 cm   LV E/e' lateral: 12.1 LV SV:         69 LV SV Index:   45 LVOT Area:     2.84 cm  RIGHT VENTRICLE             IVC RV Basal diam:  2.90 cm     IVC diam: 1.20 cm RV S prime:     22.90 cm/s TAPSE (M-mode): 2.1 cm LEFT ATRIUM             Index        RIGHT ATRIUM          Index LA diam:        2.60 cm 1.69 cm/m   RA Area:     8.67 cm LA Vol (A2C):   36.0 ml 23.35 ml/m  RA Volume:   15.60 ml 10.12 ml/m LA Vol (A4C):   44.7 ml 29.00 ml/m LA Biplane Vol: 40.8 ml 26.47 ml/m  AORTIC VALVE LVOT Vmax:   132.00 cm/s LVOT Vmean:  85.400 cm/s LVOT VTI:    0.245 m  AORTA Ao Root diam: 3.00 cm Ao Asc diam:  3.00 cm MITRAL VALVE                TRICUSPID VALVE MV Area (PHT): 3.19 cm     TR Peak grad:   37.0 mmHg MV Decel Time: 238 msec     TR Vmax:        304.00 cm/s MR Peak grad: 116.6 mmHg MR Mean grad: 80.5 mmHg  SHUNTS MR Vmax:      540.00 cm/s   Systemic VTI:  0.24 m MR Vmean:     424.5 cm/s    Systemic Diam: 1.90 cm MV E velocity: 97.00 cm/s MV A velocity: 115.00 cm/s MV E/A ratio:  0.84 Sunit Tolia Electronically signed by Madonna Large Signature Date/Time: 03/26/2024/5:14:31 PM    Final    DG Chest Left Decubitus Result Date: 03/26/2024 EXAM:  1 LEFT LATERAL DECUBITUS VIEW XRAY OF THE CHEST 03/26/2024 06:36:46 AM COMPARISON: Portable chest film from 03/26/2024, portable chest film from 02/14/2024, and portable chest film from 12/28/2016. CLINICAL HISTORY: Abnormal CXR. Abnormal chest X-ray. FINDINGS: LUNGS AND PLEURA: Dependent atelectasis. No pleural effusion. No pneumothorax. HEART AND MEDIASTINUM: The cardiac size is normal. A large hiatal hernia is again noted. No acute abnormality of the mediastinal silhouette. BONES AND SOFT TISSUES: No acute osseous abnormality. Single left lateral decubitus view demonstrates no evidence of free intraperitoneal air. There is scalloping of the right diaphragmatic surface due to multiple eventrations, which could mimic a free air crescent AP. IMPRESSION: 1. No evidence of free intraperitoneal air. 2. Multiple eventrations of the right hemidiaphragm, which very likely accounts for the reported lucency. The appearance of today's portable chest is actually very similar to that seen on 12/28/2016. 3. Large hiatal hernia. Electronically signed by: Francis Quam MD 03/26/2024 06:48 AM EST RP Workstation: [id number]   DG Chest Port 1 View Result Date: 03/26/2024 CLINICAL DATA:  Chest pain EXAM: PORTABLE CHEST 1 VIEW COMPARISON:  02/03/2024 FINDINGS: Large hiatal hernia. No acute airspace disease or effusion. Normal cardiac size with aortic atherosclerosis. Lucent area beneath right diaphragm probably soft tissue artifact IMPRESSION: 1. Large hiatal hernia 2. Lucent area beneath the right diaphragm, suspect soft tissue artifact, but consider decubitus view for further assessment Electronically Signed   By: Luke Bun M.D.   On: 03/26/2024 01:57     Labs:   Basic Metabolic Panel: Recent Labs  Lab 03/26/24 0127 03/26/24 0359 03/26/24 1319 03/27/24 0200  NA 136  --   --  142  K 3.5  --   --  3.7  CL 100  --   --  108  CO2 21*  --   --  24  GLUCOSE 154*  --   --  95  BUN 25*  --   --  16  CREATININE  1.21*  --  0.72 0.78  CALCIUM  8.7*  --   --  8.8*  MG  --  1.8  --   --    GFR Estimated Creatinine Clearance: 55 mL/min (by C-G formula based on SCr of 0.78 mg/dL). Liver Function Tests: Recent Labs  Lab 03/26/24 0127  AST 22  ALT 18  ALKPHOS 61  BILITOT 0.2  PROT 6.5  ALBUMIN 4.0   No results for input(s): LIPASE, AMYLASE in the last 168 hours. No results for input(s): AMMONIA in the last 168 hours. Coagulation profile No results for input(s): INR, PROTIME in the last 168 hours.  CBC: Recent Labs  Lab 03/26/24 0127 03/26/24 1319 03/27/24 0200  WBC 8.7 7.3 6.0  HGB 12.0 11.9* 11.5*  HCT 37.3 36.3 34.5*  MCV 91.9 91.9 89.1  PLT 328 327 281   Cardiac Enzymes: No results for input(s): CKTOTAL, CKMB, CKMBINDEX, TROPONINI in the last 168 hours. BNP: Invalid input(s): POCBNP CBG: No results for input(s): GLUCAP in the last 168 hours. D-Dimer Recent Labs    03/26/24 0753  DDIMER 0.33   Hgb A1c  No results for input(s): HGBA1C in the last 72 hours. Lipid Profile No results for input(s): CHOL, HDL, LDLCALC, TRIG, CHOLHDL, LDLDIRECT in the last 72 hours. Thyroid function studies No results for input(s): TSH, T4TOTAL, T3FREE, THYROIDAB in the last 72 hours.  Invalid input(s): FREET3 Anemia work up No results for input(s): VITAMINB12, FOLATE, FERRITIN, TIBC, IRON, RETICCTPCT in the last 72 hours. Microbiology No results found for this or any previous visit (from the past 240 hours).   Discharge Instructions:   Discharge Instructions     Diet - low sodium heart healthy   Complete by: As directed    Increase activity slowly   Complete by: As directed       Allergies as of 03/27/2024       Reactions   Ciprofloxacin Nausea Only   Doxycycline  Nausea Only   Penicillins Hives   Can take and tolerate cephalosporins   Sulfa Antibiotics Nausea Only        Medication List     PAUSE taking these  medications    hydrochlorothiazide 12.5 MG capsule Wait to take this until your doctor or other care provider tells you to start again. Commonly known as: MICROZIDE Take 12.5 mg by mouth daily.       STOP taking these medications    diphenhydrAMINE  25 MG tablet Commonly known as: BENADRYL    lisinopril  5 MG tablet Commonly known as: ZESTRIL        TAKE these medications    acetaminophen  325 MG tablet Commonly known as: TYLENOL  Take 1,000 mg by mouth 2 (two) times daily.   albuterol  108 (90 Base) MCG/ACT inhaler Commonly known as: VENTOLIN  HFA Inhale 2 puffs into the lungs every 6 (six) hours as needed for shortness of breath or wheezing.   aspirin  EC 81 MG tablet Take 81 mg by mouth daily.   atorvastatin  80 MG tablet Commonly known as: LIPITOR Take 80 mg by mouth daily.   DULoxetine  60 MG capsule Commonly known as: CYMBALTA  TAKE ONE CAPSULE BY MOUTH ONCE DAILY   fenofibrate  145 MG tablet Commonly known as: TRICOR  Take 145 mg by mouth daily.   HYDROcodone -acetaminophen  5-325 MG tablet Commonly known as: NORCO/VICODIN Take 1 tablet by mouth every 6 (six) hours as needed for moderate pain.   nitroGLYCERIN  0.4 MG SL tablet Commonly known as: Nitrostat  Place 1 tablet (0.4 mg total) under the tongue every 5 (five) minutes as needed for chest pain.   ondansetron  4 MG disintegrating tablet Commonly known as: ZOFRAN -ODT Take 4 mg by mouth every 8 (eight) hours as needed.   pantoprazole  40 MG tablet Commonly known as: PROTONIX  TAKE ONE TABLET BY MOUTH ONCE DAILY   propranolol  40 MG tablet Commonly known as: INDERAL  TAKE ONE TABLET BY MOUTH TWICE DAILY   tamsulosin  0.4 MG Caps capsule Commonly known as: FLOMAX  Take 1 capsule (0.4 mg total) by mouth daily.   valsartan 160 MG tablet Commonly known as: DIOVAN Take 160 mg by mouth daily.        Follow-up Information     Duwayne Katheryn HERO, PA Follow up in 1 week(s).   Specialty: General Practice Contact  information: [address] [place] [address] [phone]         Raylene Debby MATSU., MD. Schedule an appointment as soon as possible for a visit today.   Specialty: Cardiology Contact information: [address] [place] [address] [phone]  Time coordinating discharge: 45 min  Signed:  Harlene RAYMOND Bowl DO  [facility] 03/27/2024, 8:16 AM      "

## 2024-03-27 NOTE — Care Management Obs Status (Signed)
 MEDICARE OBSERVATION STATUS NOTIFICATION   Patient Details  Name: Heather Cruz MRN: [id number] Date of Birth: October 14, 1960   Medicare Observation Status Notification Given:  Yes    Debarah Saunas, RN 03/27/2024, 7:26 AM

## 2024-03-27 NOTE — Discharge Instructions (Signed)
 Follow up with your cardiologist-- call to make appointment today
